# Patient Record
Sex: Male | Born: 1990 | Race: Black or African American | Hispanic: No | Marital: Single | Smoking: Current every day smoker
Health system: Southern US, Community
[De-identification: ages and names within clinical notes are randomized; demographics above are authoritative.]

## PROBLEM LIST (undated history)

## (undated) DIAGNOSIS — J45909 Unspecified asthma, uncomplicated: Secondary | ICD-10-CM

## (undated) HISTORY — PX: NO PAST SURGERIES: SHX2092

---
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
## 2010-02-17 ENCOUNTER — Emergency Department (HOSPITAL_COMMUNITY)
Admission: EM | Admit: 2010-02-17 | Discharge: 2010-02-18 | Payer: Self-pay | Source: Home / Self Care | Admitting: Emergency Medicine

## 2010-04-12 ENCOUNTER — Emergency Department (HOSPITAL_COMMUNITY): Admission: EM | Admit: 2010-04-12 | Discharge: 2010-04-12 | Payer: Self-pay | Admitting: Emergency Medicine

## 2010-04-15 ENCOUNTER — Emergency Department (HOSPITAL_COMMUNITY): Admission: EM | Admit: 2010-04-15 | Discharge: 2010-04-15 | Payer: Self-pay | Admitting: Emergency Medicine

## 2010-09-23 LAB — WOUND CULTURE

## 2010-09-27 LAB — GLUCOSE, CAPILLARY: Glucose-Capillary: 86 mg/dL (ref 70–99)

## 2012-02-06 ENCOUNTER — Encounter (HOSPITAL_COMMUNITY): Payer: Self-pay | Admitting: *Deleted

## 2012-02-06 DIAGNOSIS — R197 Diarrhea, unspecified: Secondary | ICD-10-CM | POA: Insufficient documentation

## 2012-02-06 DIAGNOSIS — R112 Nausea with vomiting, unspecified: Secondary | ICD-10-CM | POA: Insufficient documentation

## 2012-02-06 DIAGNOSIS — F17200 Nicotine dependence, unspecified, uncomplicated: Secondary | ICD-10-CM | POA: Insufficient documentation

## 2012-02-06 NOTE — ED Notes (Signed)
Pt c/o generalized abdominal pain for the past 5 hours, dry heaving.  Last ate at 2 pm.  Diarrhea as well.  Denies dysuria and urinary frequency.

## 2012-02-07 ENCOUNTER — Emergency Department (HOSPITAL_COMMUNITY)
Admission: EM | Admit: 2012-02-07 | Discharge: 2012-02-07 | Disposition: A | Discharge status: Home / Self Care | Payer: Self-pay | Attending: Emergency Medicine | Admitting: Emergency Medicine

## 2012-02-07 DIAGNOSIS — R112 Nausea with vomiting, unspecified: Secondary | ICD-10-CM

## 2012-02-07 HISTORY — DX: Unspecified asthma, uncomplicated: J45.909

## 2012-02-07 LAB — CBC WITH DIFFERENTIAL/PLATELET
Basophils Absolute: 0 K/uL (ref 0.0–0.1)
Basophils Relative: 0 % (ref 0–1)
Eosinophils Absolute: 0.2 K/uL (ref 0.0–0.7)
Eosinophils Relative: 2 % (ref 0–5)
HCT: 48.9 % (ref 39.0–52.0)
Hemoglobin: 16.6 g/dL (ref 13.0–17.0)
Lymphocytes Relative: 7 % — ABNORMAL LOW (ref 12–46)
Lymphs Abs: 0.9 K/uL (ref 0.7–4.0)
MCH: 29 pg (ref 26.0–34.0)
MCHC: 33.9 g/dL (ref 30.0–36.0)
MCV: 85.5 fL (ref 78.0–100.0)
Monocytes Absolute: 0.5 K/uL (ref 0.1–1.0)
Monocytes Relative: 4 % (ref 3–12)
Neutro Abs: 11.5 K/uL — ABNORMAL HIGH (ref 1.7–7.7)
Neutrophils Relative %: 88 % — ABNORMAL HIGH (ref 43–77)
Platelets: 237 K/uL (ref 150–400)
RBC: 5.72 MIL/uL (ref 4.22–5.81)
RDW: 13.6 % (ref 11.5–15.5)
WBC: 13.1 K/uL — ABNORMAL HIGH (ref 4.0–10.5)

## 2012-02-07 LAB — BASIC METABOLIC PANEL
Calcium: 9.9 mg/dL (ref 8.4–10.5)
Creatinine, Ser: 0.84 mg/dL (ref 0.50–1.35)
GFR calc Af Amer: >90 mL/min (ref >90)
Glucose, Bld: 82 mg/dL (ref 70–99)

## 2012-02-07 LAB — URINALYSIS, ROUTINE W REFLEX MICROSCOPIC
Glucose, UA: NEGATIVE mg/dL
Ketones, ur: 15 mg/dL — AB
Nitrite: NEGATIVE
Specific Gravity, Urine: 1.028 (ref 1.005–1.030)

## 2012-02-07 MED ORDER — SODIUM CHLORIDE 0.9 % IV BOLUS (SEPSIS)
1000.0000 mL | Freq: Once | INTRAVENOUS | Status: AC
  Administered 2012-02-07: 1000 mL via INTRAVENOUS

## 2012-02-07 MED ORDER — ONDANSETRON HCL 4 MG/2ML IJ SOLN
4.0000 mg | Freq: Once | INTRAMUSCULAR | Status: AC
  Administered 2012-02-07: 4 mg via INTRAVENOUS
  Filled 2012-02-07: qty 2

## 2012-02-07 MED ORDER — PROMETHAZINE HCL 25 MG PO TABS: 25.0000 mg | ORAL_TABLET | Freq: Four times a day (QID) | ORAL | Status: DC | PRN

## 2012-02-07 NOTE — ED Provider Notes (Signed)
Medical screening examination/treatment/procedure(s) were performed by non-physician practitioner and as supervising physician I was immediately available for consultation/collaboration.  Tahira Olivarez M Olita Takeshita, MD 02/07/12 0646 

## 2012-02-07 NOTE — ED Provider Notes (Signed)
History     CSN: [id number]  Arrival date & time 02/06/12  2321   First MD Initiated Contact with Patient 02/07/12 0122      Chief Complaint  Patient presents with  . Abdominal Pain    (Consider location/radiation/quality/duration/timing/severity/associated sxs/prior treatment) HPI Comments: And states, that approximately 24 hours ago.  He was awakened from sleep with nausea, vomiting, and diarrhea.  This has persisted throughout the day.  He has taken no over-the-counter medications to alleviate any of his symptoms.  He does not have any known sick contacts.  He has not had any fever.  He does report that he had a chill yesterday  Patient is a 21 y.o. male presenting with abdominal pain. The history is provided by the patient.  Abdominal Pain The primary symptoms of the illness include abdominal pain, nausea, vomiting and diarrhea. The primary symptoms of the illness do not include fever or shortness of breath. The current episode started yesterday. The onset of the illness was gradual. The problem has not changed since onset. The patient has not had a change in bowel habit. Additional symptoms associated with the illness include chills.    Past Medical History  Diagnosis Date  . Asthma     History reviewed. No pertinent past surgical history.  History reviewed. No pertinent family history.  History  Substance Use Topics  . Smoking status: Current Everyday Smoker -- 1.0 packs/day  . Smokeless tobacco: Not on file  . Alcohol Use: Yes      Review of Systems  Constitutional: Positive for chills. Negative for fever.  HENT: Negative for rhinorrhea.   Respiratory: Negative for shortness of breath.   Gastrointestinal: Positive for nausea, vomiting, abdominal pain and diarrhea.  Genitourinary: Negative for decreased urine volume.  Neurological: Negative for dizziness, weakness and headaches.    Allergies  Review of patient's allergies indicates no known allergies.  Home  Medications   Current Outpatient Rx  Name Route Sig Dispense Refill  . PROMETHAZINE HCL 25 MG PO TABS Oral Take 1 tablet (25 mg total) by mouth every 6 (six) hours as needed for nausea. 30 tablet 0    BP 130/57  Pulse 95  Temp 99 F (37.2 C) (Oral)  Resp 18  SpO2 100%  Physical Exam  Constitutional: He is oriented to person, place, and time. He appears well-developed and well-nourished.  HENT:  Head: Normocephalic.  Neck: Normal range of motion.  Cardiovascular: Normal rate.   Pulmonary/Chest: Effort normal.  Abdominal: Soft. He exhibits no distension. There is no tenderness.  Neurological: He is alert and oriented to person, place, and time.  Skin: Skin is warm.    ED Course  Procedures (including critical care time)  Labs Reviewed  URINALYSIS, ROUTINE W REFLEX MICROSCOPIC - Abnormal; Notable for the following:    Color, Urine AMBER (*)  BIOCHEMICALS MAY BE AFFECTED BY COLOR   Bilirubin Urine SMALL (*)     Ketones, ur 15 (*)     All other components within normal limits  CBC WITH DIFFERENTIAL - Abnormal; Notable for the following:    WBC 13.1 (*)     Neutrophils Relative 88 (*)     Neutro Abs 11.5 (*)     Lymphocytes Relative 7 (*)     All other components within normal limits  BASIC METABOLIC PANEL   No results found.   1. Nausea vomiting and diarrhea       MDM   After patient was medicated for nausea, with  5 mg of Zofran given 30 mg of Toradol, IV, and 1 L of fluid.  He is feeling much, better, we'll try a by mouth challenge        Arman Filter, NP 02/07/12 [phone]

## 2012-02-07 NOTE — ED Notes (Addendum)
Pt states he is having mid abdominal pain 1 a.m.Marland Kitchen Pt states he had 1 bm about 3 hours that was solid. Pt states pain is at 3. Pt denies taking any thing for pain. PT c/o nausea/ pt denies vomiting but c/o dry heaving. Pt states he hasn't ate since 1pm. Pt c/o of lose of appetite.

## 2013-01-29 ENCOUNTER — Encounter (HOSPITAL_COMMUNITY): Payer: Self-pay | Admitting: Adult Health

## 2013-01-29 ENCOUNTER — Emergency Department (HOSPITAL_COMMUNITY)
Admission: EM | Admit: 2013-01-29 | Discharge: 2013-01-30 | Discharge status: Against Medical Advice | Payer: Self-pay | Attending: Emergency Medicine | Admitting: Emergency Medicine

## 2013-01-29 DIAGNOSIS — R209 Unspecified disturbances of skin sensation: Secondary | ICD-10-CM | POA: Insufficient documentation

## 2013-01-29 LAB — CBC
HCT: 45.2 % (ref 39.0–52.0)
MCH: 29.3 pg (ref 26.0–34.0)
MCV: 81.9 fL (ref 78.0–100.0)
RDW: 13.2 % (ref 11.5–15.5)
WBC: 9.7 10*3/uL (ref 4.0–10.5)

## 2013-01-29 LAB — BASIC METABOLIC PANEL
BUN: 12 mg/dL (ref 6–23)
Creatinine, Ser: 0.96 mg/dL (ref 0.50–1.35)
GFR calc Af Amer: >90 mL/min (ref >90)
GFR calc non Af Amer: >90 mL/min (ref >90)

## 2013-01-29 LAB — POCT I-STAT TROPONIN I: Troponin i, poc: 0.01 ng/mL (ref 0.00–0.08)

## 2013-01-29 NOTE — ED Notes (Signed)
NURSE FIRST ROUNDS : NURSE EXPLAINED DELAY / PROCESS AND DELAY TO PT, ALERT / RESPIRATIONS UNLABORED / DENIES  PAIN AT THIS TIME.

## 2013-01-29 NOTE — ED Notes (Signed)
Presents with left sided arm tingling and pain that began Monday and is intermittent. It started with a sharp left chest pain then tingling from left shoulder to toes associated with SOB on Monday. Denies nausea and vomiting, denis dizziness.

## 2013-01-29 NOTE — ED Notes (Signed)
Pt requesting to leave AMA. Pt seen leaving with friend.

## 2013-02-01 ENCOUNTER — Emergency Department (HOSPITAL_COMMUNITY)
Admission: EM | Admit: 2013-02-01 | Discharge: 2013-02-01 | Disposition: A | Discharge status: Home / Self Care | Payer: Self-pay | Attending: Emergency Medicine | Admitting: Emergency Medicine

## 2013-02-01 ENCOUNTER — Encounter (HOSPITAL_COMMUNITY): Payer: Self-pay | Admitting: *Deleted

## 2013-02-01 DIAGNOSIS — F17200 Nicotine dependence, unspecified, uncomplicated: Secondary | ICD-10-CM | POA: Insufficient documentation

## 2013-02-01 DIAGNOSIS — IMO0002 Reserved for concepts with insufficient information to code with codable children: Secondary | ICD-10-CM | POA: Insufficient documentation

## 2013-02-01 DIAGNOSIS — J45909 Unspecified asthma, uncomplicated: Secondary | ICD-10-CM | POA: Insufficient documentation

## 2013-02-01 DIAGNOSIS — M792 Neuralgia and neuritis, unspecified: Secondary | ICD-10-CM

## 2013-02-01 MED ORDER — IBUPROFEN 800 MG PO TABS: 800.0000 mg | ORAL_TABLET | Freq: Three times a day (TID) | ORAL | Status: DC

## 2013-02-01 MED ORDER — METHOCARBAMOL 500 MG PO TABS: 500.0000 mg | ORAL_TABLET | Freq: Two times a day (BID) | ORAL | Status: DC

## 2013-02-01 NOTE — ED Notes (Signed)
The pt is c/o lt hand pain no known injury. He had lt arm shoulder and elbow pain 4 days ago.  No history  Cardiac or otherwise

## 2013-02-01 NOTE — ED Provider Notes (Signed)
CSN: [id number]     Arrival date & time 02/01/13  1757 History  This chart was scribed for non-physician practitioner, Fayrene Helper, PA-C working with Alvin Webb. Oletta Lamas, MD by Greggory Stallion, ED scribe. This patient was seen in room TR08C/TR08C and the patient's care was started at 6:14 PM.   Chief Complaint  Patient presents with  . Hand Pain   The history is provided by the patient. No language interpreter was used.    HPI Comments: Alvin Webb is a 22 y.o. male who presents to the Emergency Department complaining of gradual onset, constant sharp, throbbing left forearm pain that radiates to his hand that started 4 days ago. He states his whole arm was hurting at first. Pt denies injury. Pt states flexing his hand relieves some of the pain. He had CP and trouble breathing for 15 minutes a few days ago but it is resolved now. Pt denies fever, chills and cough as associated symptoms. He states he donated plasma 2 days ago from his right arm. Pt had asthma until he was about 22 years old.   Past Medical History  Diagnosis Date  . Asthma    History reviewed. No pertinent past surgical history. No family history on file. History  Substance Use Topics  . Smoking status: Current Every Day Smoker -- 1.00 packs/day  . Smokeless tobacco: Not on file  . Alcohol Use: Yes    Review of Systems  Constitutional: Negative for fever and chills.  Respiratory: Negative for cough.   Cardiovascular: Negative for chest pain.  Musculoskeletal: Positive for myalgias and arthralgias.  All other systems reviewed and are negative.    Allergies  Review of patient's allergies indicates no known allergies.  Home Medications   Current Outpatient Rx  Name  Route  Sig  Dispense  Refill  . ibuprofen (ADVIL,MOTRIN) 200 MG tablet   Oral   Take 400 mg by mouth every 6 (six) hours as needed for headache.          BP 150/74  Pulse 89  Temp(Src) 98.5 F (36.9 C) (Oral)  Resp 16  SpO2 98%  Physical  Exam  Nursing note and vitals reviewed. Constitutional: He is oriented to person, place, and time. He appears well-developed and well-nourished. No distress.  HENT:  Head: Normocephalic and atraumatic.  Eyes: EOM are normal.  Neck: Neck supple. No tracheal deviation present.  Cardiovascular: Normal rate, regular rhythm and normal heart sounds.   Pulmonary/Chest: Effort normal and breath sounds normal. No respiratory distress. He has no wheezes. He has no rales.  Musculoskeletal: Normal range of motion.  Full ROM of left wrist, shoulder and elbow. Soft compartment in forearm. No rash. No edema. Strong radial pulse. Good grip strength.   Neurological: He is alert and oriented to person, place, and time.  Intact distal sensation to all fingers.   Skin: Skin is warm and dry. No rash noted.  Psychiatric: He has a normal mood and affect. His behavior is normal.    ED Course   Procedures (including critical care time)  DIAGNOSTIC STUDIES: Oxygen Saturation is 98% on RA, normal by my interpretation.    COORDINATION OF CARE: 6:25 PM-Discussed treatment plan which includes ibuprofen and a muscle relaxer with pt at bedside and pt agreed to plan. Advised pt there is no injury to his arm since he has full ROM. Since there is no rash or redness, advised pt it is not an infection. Advised pt to follow up with an orthopaedist  if symptoms worsen or do not resolve.   Low suspicion for cardiopulmonary etiology. Pt is neurovascularly intact.     Labs Reviewed - No data to display No results found. 1. Radicular pain in left arm     MDM  BP 150/74  Pulse 89  Temp(Src) 98.5 F (36.9 C) (Oral)  Resp 16  SpO2 98%   I personally performed the services described in this documentation, which was scribed in my presence. The recorded information has been reviewed and is accurate.    Fayrene Helper, PA-C 02/01/13 [phone]

## 2013-02-02 NOTE — ED Provider Notes (Signed)
Medical screening examination/treatment/procedure(s) were performed by non-physician practitioner and as supervising physician I was immediately available for consultation/collaboration.   Kolby Y. Tamberlyn Midgley, MD 02/02/13 1702 

## 2013-05-05 ENCOUNTER — Encounter (HOSPITAL_COMMUNITY): Payer: Self-pay | Admitting: Emergency Medicine

## 2013-05-05 ENCOUNTER — Emergency Department (HOSPITAL_COMMUNITY)
Admission: EM | Admit: 2013-05-05 | Discharge: 2013-05-05 | Disposition: A | Discharge status: Home / Self Care | Payer: Self-pay | Attending: Emergency Medicine | Admitting: Emergency Medicine

## 2013-05-05 ENCOUNTER — Emergency Department (HOSPITAL_COMMUNITY): Payer: Self-pay

## 2013-05-05 DIAGNOSIS — J45909 Unspecified asthma, uncomplicated: Secondary | ICD-10-CM | POA: Insufficient documentation

## 2013-05-05 DIAGNOSIS — F17200 Nicotine dependence, unspecified, uncomplicated: Secondary | ICD-10-CM | POA: Insufficient documentation

## 2013-05-05 DIAGNOSIS — S62629A Displaced fracture of medial phalanx of unspecified finger, initial encounter for closed fracture: Secondary | ICD-10-CM

## 2013-05-05 DIAGNOSIS — IMO0002 Reserved for concepts with insufficient information to code with codable children: Secondary | ICD-10-CM | POA: Insufficient documentation

## 2013-05-05 DIAGNOSIS — Y929 Unspecified place or not applicable: Secondary | ICD-10-CM | POA: Insufficient documentation

## 2013-05-05 DIAGNOSIS — Y990 Civilian activity done for income or pay: Secondary | ICD-10-CM | POA: Insufficient documentation

## 2013-05-05 MED ORDER — HYDROCODONE-ACETAMINOPHEN 5-325 MG PO TABS: 2.0000 | ORAL_TABLET | ORAL | Status: DC | PRN

## 2013-05-05 MED ORDER — HYDROCODONE-ACETAMINOPHEN 5-325 MG PO TABS
1.0000 | ORAL_TABLET | Freq: Once | ORAL | Status: AC
  Administered 2013-05-05: 1 via ORAL
  Filled 2013-05-05: qty 1

## 2013-05-05 NOTE — Progress Notes (Signed)
Orthopedic Tech Progress Note Patient Details:  Alvin Webb Feb 15, 1991 [id number]  Ortho Devices Type of Ortho Device: Finger splint   Alvin Webb 05/05/2013, 11:26 PM

## 2013-05-05 NOTE — ED Provider Notes (Signed)
Medical screening examination/treatment/procedure(s) were performed by non-physician practitioner and as supervising physician I was immediately available for consultation/collaboration.  EKG Interpretation   None        Ethelda Chick, MD 05/05/13 [phone]

## 2013-05-05 NOTE — ED Notes (Signed)
Ortho paged. 

## 2013-05-05 NOTE — ED Provider Notes (Signed)
CSN: [id number]     Arrival date & time 05/05/13  2117 History  This chart was scribed for non-physician practitioner, Wynetta Emery, PA-C working with Ethelda Chick, MD by Greggory Stallion, ED scribe. This patient was seen in room TR08C/TR08C and the patient's care was started at 10:47 PM.   Chief Complaint  Patient presents with  . Finger Injury   The history is provided by the patient. No language interpreter was used.   HPI Comments: Alvin Webb is a 22 y.o. male who presents to the Emergency Department complaining of right middle finger injury that occurred earlier today when he jammed his finger on a tire at work. He has sudden onset right middle finger pain with associated swelling. Pt rates his pain 7/10. Pt states he saw a deformity when it happened so he pulled his finger straight and the pain went away but returned a few hours later.   Past Medical History  Diagnosis Date  . Asthma    History reviewed. No pertinent past surgical history. History reviewed. No pertinent family history. History  Substance Use Topics  . Smoking status: Current Every Day Smoker -- 1.00 packs/day  . Smokeless tobacco: Not on file  . Alcohol Use: Yes    Review of Systems A complete 10 system review of systems was obtained and all systems are negative except as noted in the HPI and PMH.   Allergies  Review of patient's allergies indicates no known allergies.  Home Medications   Current Outpatient Rx  Name  Route  Sig  Dispense  Refill  . ibuprofen (ADVIL,MOTRIN) 200 MG tablet   Oral   Take 800 mg by mouth every 6 (six) hours as needed for moderate pain.         Marland Kitchen HYDROcodone-acetaminophen (NORCO/VICODIN) 5-325 MG per tablet   Oral   Take 2 tablets by mouth every 4 (four) hours as needed.   6 tablet   0    BP 155/91  Pulse 68  Resp 16  SpO2 100%  Physical Exam  Nursing note and vitals reviewed. Constitutional: He is oriented to person, place, and time. He appears  well-developed and well-nourished. No distress.  HENT:  Head: Normocephalic.  Eyes: Conjunctivae and EOM are normal.  Cardiovascular: Normal rate.   Pulmonary/Chest: Effort normal. No stridor.  Musculoskeletal: Normal range of motion.  Mild swelling to right middle finger PIP. Mildly reduced range of motion, neurovascularly intact.  Neurological: He is alert and oriented to person, place, and time.  Psychiatric: He has a normal mood and affect.    ED Course  Procedures (including critical care time)  DIAGNOSTIC STUDIES: Oxygen Saturation is 100% on RA, normal by my interpretation.    COORDINATION OF CARE: 10:50 PM-Discussed treatment plan which includes pain medication and a finger splint with pt at bedside and pt agreed to plan. Advised pt to follow up with a hand specialist.   Labs Review Labs Reviewed - No data to display Imaging Review [name]  05/05/2013   CLINICAL DATA:  Finger injury.  EXAM: RIGHT HAND - COMPLETE 3+ VIEW  COMPARISON:  Concern none available  FINDINGS: Tiny avulsion fracture base of 3rd middle phalanx, volar aspect. No dislocation. No additional fractures. No destructive bony lesions. Joint space intact without erosions. Third digit soft tissue swelling without subcutaneous gas or radiopaque foreign bodies.  IMPRESSION: Tiny avulsion injury base of 3rd middle phalanx, volar aspect, please see finger radiograph from same day, reported separately for dedicated  findings. No dislocation or additional fractures.   Electronically Signed   By: Awilda Metro   On: 05/05/2013 22:41   [name]  05/05/2013   CLINICAL DATA:  Finger injury.  EXAM: RIGHT MIDDLE FINGER 2+V  COMPARISON:  None available for comparison at time of study interpretation.  FINDINGS: Tiny apparent avulsion fracture with 2 mm bony fragment arising from the base of 3rd middle phalanx, volar aspect. No dislocation. No destructive bony lesions. Third digit soft tissue  swelling without subcutaneous gas or radiopaque foreign bodies.  IMPRESSION: Avulsion fracture base of 3rd middle phalanx, volar aspect without dislocation.   Electronically Signed   By: Awilda Metro   On: 05/05/2013 22:34    EKG Interpretation   None       MDM   1. Avulsion fracture of middle phalanx of finger, closed, initial encounter    Filed Vitals:   05/05/13 2125  BP: 155/91  Pulse: 68  Resp: 16  SpO2: 100%     [name] is a 22 y.o. male with small avulsion fracture to right middle digit middle phalanx. Patient will be given a finger splint, pain control and hand followup.  Medications  HYDROcodone-acetaminophen (NORCO/VICODIN) 5-325 MG per tablet 1 tablet (not administered)    Pt is hemodynamically stable, appropriate for, and amenable to discharge at this time. Pt verbalized understanding and agrees with care plan. All questions answered. Outpatient follow-up and specific return precautions discussed.    New Prescriptions   HYDROCODONE-ACETAMINOPHEN (NORCO/VICODIN) 5-325 MG PER TABLET    Take 2 tablets by mouth every 4 (four) hours as needed.    I personally performed the services described in this documentation, which was scribed in my presence. The recorded information has been reviewed and is accurate.  Note: Portions of this report may have been transcribed using voice recognition software. Every effort was made to ensure accuracy; however, inadvertent computerized transcription errors may be present    Wynetta Emery, PA-C 05/05/13 2259

## 2013-05-05 NOTE — ED Notes (Signed)
Presents with right middle finger swelling and pain began after a tire jumped up and jammed finger while working. Reports seeing a deformity and pulling the finger straight, the pain then went away, however returned a few hours ;ater. Pt finished rest of day at work and came here to be evaluated. CMS intact.

## 2013-05-05 NOTE — ED Notes (Signed)
Patient transported to X-ray 

## 2013-09-11 ENCOUNTER — Emergency Department (HOSPITAL_COMMUNITY)
Admission: EM | Admit: 2013-09-11 | Discharge: 2013-09-12 | Disposition: A | Discharge status: Home / Self Care | Payer: Self-pay | Attending: Emergency Medicine | Admitting: Emergency Medicine

## 2013-09-11 ENCOUNTER — Encounter (HOSPITAL_COMMUNITY): Payer: Self-pay | Admitting: Emergency Medicine

## 2013-09-11 DIAGNOSIS — J45909 Unspecified asthma, uncomplicated: Secondary | ICD-10-CM | POA: Insufficient documentation

## 2013-09-11 DIAGNOSIS — F17200 Nicotine dependence, unspecified, uncomplicated: Secondary | ICD-10-CM | POA: Insufficient documentation

## 2013-09-11 DIAGNOSIS — IMO0002 Reserved for concepts with insufficient information to code with codable children: Secondary | ICD-10-CM | POA: Insufficient documentation

## 2013-09-11 DIAGNOSIS — Y9355 Activity, bike riding: Secondary | ICD-10-CM | POA: Insufficient documentation

## 2013-09-11 DIAGNOSIS — Y92410 Unspecified street and highway as the place of occurrence of the external cause: Secondary | ICD-10-CM | POA: Insufficient documentation

## 2013-09-11 DIAGNOSIS — S82853A Displaced trimalleolar fracture of unspecified lower leg, initial encounter for closed fracture: Secondary | ICD-10-CM | POA: Insufficient documentation

## 2013-09-11 MED ORDER — HYDROMORPHONE HCL PF 1 MG/ML IJ SOLN
1.0000 mg | Freq: Once | INTRAMUSCULAR | Status: AC
  Administered 2013-09-11: 1 mg via INTRAVENOUS
  Filled 2013-09-11: qty 1

## 2013-09-11 MED ORDER — ONDANSETRON HCL 4 MG/2ML IJ SOLN
4.0000 mg | Freq: Once | INTRAMUSCULAR | Status: AC
  Administered 2013-09-11: 4 mg via INTRAVENOUS
  Filled 2013-09-11: qty 2
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
## 2013-09-11 NOTE — ED Notes (Signed)
C-Collar removed from pt by Dr. Anitra LauthPlunkett.  Pt log rolled and spine board remove while using c-spine precautions.  Pt tolerated well.

## 2013-09-11 NOTE — ED Notes (Signed)
Pt states he did have alcohol to drink today.

## 2013-09-12 ENCOUNTER — Emergency Department (HOSPITAL_COMMUNITY): Payer: Self-pay

## 2013-09-12 ENCOUNTER — Emergency Department (HOSPITAL_COMMUNITY): Payer: No Typology Code available for payment source

## 2013-09-12 MED ORDER — HYDROMORPHONE HCL PF 1 MG/ML IJ SOLN
1.0000 mg | Freq: Once | INTRAMUSCULAR | Status: AC
  Administered 2013-09-12: 1 mg via INTRAVENOUS
  Filled 2013-09-12: qty 1

## 2013-09-12 MED ORDER — OXYCODONE-ACETAMINOPHEN 5-325 MG PO TABS: 1.0000 | ORAL_TABLET | Freq: Four times a day (QID) | ORAL | Status: DC | PRN

## 2013-09-12 MED ORDER — HYDROMORPHONE HCL PF 1 MG/ML IJ SOLN
1.0000 mg | Freq: Once | INTRAMUSCULAR | Status: AC
Start: 2013-09-12 — End: 2013-09-12
  Administered 2013-09-12: 1 mg via INTRAVENOUS
  Filled 2013-09-12: qty 1

## 2013-09-12 NOTE — Discharge Instructions (Signed)
°  Ankle Fracture °A fracture is a break in the bone. A cast or splint is used to protect and keep your injured bone from moving.  °HOME CARE INSTRUCTIONS  °· Use your crutches as directed. °· To lessen the swelling, keep the injured leg elevated while sitting or lying down. °· Apply ice to the injury for 15-20 minutes, 03-04 times per day while awake for 2 days. Put the ice in a plastic bag and place a thin towel between the bag of ice and your cast. °· If you have a plaster or fiberglass cast: °· Do not try to scratch the skin under the cast using sharp or pointed objects. °· Check the skin around the cast every day. You may put lotion on any red or sore areas. °· Keep your cast dry and clean. °· If you have a plaster splint: °· Wear the splint as directed. °· You may loosen the elastic around the splint if your toes become numb, tingle, or turn cold or blue. °· Do not put pressure on any part of your cast or splint; it may break. Rest your cast only on a pillow the first 24 hours until it is fully hardened. °· Your cast or splint can be protected during bathing with a plastic bag. Do not lower the cast or splint into water. °· Take medications as directed by your caregiver. Only take over-the-counter or prescription medicines for pain, discomfort, or fever as directed by your caregiver. °· Do not drive a vehicle until your caregiver specifically tells you it is safe to do so. °· If your caregiver has given you a follow-up appointment, it is very important to keep that appointment. Not keeping the appointment could result in a chronic or permanent injury, pain, and disability. If there is any problem keeping the appointment, you must call back to this facility for assistance. °SEEK IMMEDIATE MEDICAL CARE IF:  °· Your cast gets damaged or breaks. °· You have continued severe pain or more swelling than you did before the cast was put on. °· Your skin or toenails below the injury turn blue or gray, or feel cold or  numb. °· There is a bad smell or new stains and/or purulent (pus like) drainage coming from under the cast. °If you do not have a window in your cast for observing the wound, a discharge or minor bleeding may show up as a stain on the outside of your cast. Report these findings to your caregiver. °MAKE SURE YOU:  °· Understand these instructions. °· Will watch your condition. °· Will get help right away if you are not doing well or get worse. °Document Released: 06/02/2000 Document Revised: 08/28/2011 Document Reviewed: 01/02/2013 °[employer]® Patient Information ©2014 [employer]. ° ° °
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
## 2013-09-18 ENCOUNTER — Encounter (HOSPITAL_COMMUNITY): Payer: Self-pay | Admitting: Pharmacy Technician
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
## 2013-09-24 NOTE — Pre-Procedure Instructions (Signed)
Alvin SchillingMichael Webb  09/24/2013   Your procedure is scheduled on:  09/26/13  [facility]  [facility] * 3 at 6 AM.  Call this number if you have problems the morning of surgery: [phone]   Remember:   Do not eat food or drink liquids after midnight.   Take these medicines the morning of surgery with A SIP OF WATER: oxycodone   Do not wear jewelry, make-up or nail polish.  Do not wear lotions, powders, or perfumes. You may wear deodorant.  Do not shave 48 hours prior to surgery. Men may shave face and neck.  Do not bring valuables to the hospital.  [facility]                  for any belongings or valuables.               Contacts, dentures or bridgework may not be worn into surgery.  Leave suitcase in the car. After surgery it may be brought to your room.  For patients admitted to the hospital, discharge time is determined by your                treatment team.               Patients discharged the day of surgery will not be allowed to drive  home.  Name and phone number of your driver: family  Special Instructions: Shower using CHG 2 nights before surgery and the night before surgery.  If you shower the day of surgery use CHG.  Use special wash - you have one bottle of CHG for all showers.  You should use approximately 1/3 of the bottle for each shower.   Please read over the following fact sheets that you were given: Pain Booklet, Coughing and Deep Breathing and Surgical Site Infection Prevention

## 2013-09-25 ENCOUNTER — Encounter (HOSPITAL_COMMUNITY)
Admission: RE | Admit: 2013-09-25 | Discharge: 2013-09-25 | Disposition: A | Discharge status: Home / Self Care | Payer: Self-pay | Source: Ambulatory Visit | Attending: Orthopedic Surgery | Admitting: Orthopedic Surgery

## 2013-09-25 ENCOUNTER — Encounter (HOSPITAL_COMMUNITY): Payer: Self-pay

## 2013-09-25 DIAGNOSIS — Z01812 Encounter for preprocedural laboratory examination: Secondary | ICD-10-CM | POA: Insufficient documentation

## 2013-09-25 LAB — BASIC METABOLIC PANEL
BUN: 9 mg/dL (ref 6–23)
CALCIUM: 9.7 mg/dL (ref 8.4–10.5)
CO2: 26 meq/L (ref 19–32)
Chloride: 101 mEq/L (ref 96–112)
Creatinine, Ser: 0.96 mg/dL (ref 0.50–1.35)
GFR calc Af Amer: >90 mL/min (ref >90)
GFR calc non Af Amer: >90 mL/min (ref >90)
GLUCOSE: 73 mg/dL (ref 70–99)
POTASSIUM: 4.3 meq/L (ref 3.7–5.3)
SODIUM: 142 meq/L (ref 137–147)

## 2013-09-25 LAB — CBC
HCT: 44.1 % (ref 39.0–52.0)
HEMOGLOBIN: 14.9 g/dL (ref 13.0–17.0)
MCH: 28.8 pg (ref 26.0–34.0)
MCHC: 33.8 g/dL (ref 30.0–36.0)
MCV: 85.1 fL (ref 78.0–100.0)
Platelets: 352 10*3/uL (ref 150–400)
RBC: 5.18 MIL/uL (ref 4.22–5.81)
RDW: 12.6 % (ref 11.5–15.5)
WBC: 10.3 10*3/uL (ref 4.0–10.5)

## 2013-09-25 MED ORDER — CEFAZOLIN SODIUM-DEXTROSE 2-3 GM-% IV SOLR
2.0000 g | INTRAVENOUS | Status: AC
  Administered 2013-09-26: 2 g via INTRAVENOUS
  Filled 2013-09-25: qty 50

## 2013-09-25 MED ORDER — ACETAMINOPHEN 500 MG PO TABS
1000.0000 mg | ORAL_TABLET | Freq: Once | ORAL | Status: AC
  Administered 2013-09-26: 1000 mg via ORAL
  Filled 2013-09-25: qty 2

## 2013-09-26 ENCOUNTER — Ambulatory Visit (HOSPITAL_COMMUNITY): Payer: Self-pay | Admitting: Anesthesiology

## 2013-09-26 ENCOUNTER — Ambulatory Visit (HOSPITAL_COMMUNITY): Payer: Self-pay

## 2013-09-26 ENCOUNTER — Ambulatory Visit (HOSPITAL_COMMUNITY)
Admission: RE | Admit: 2013-09-26 | Discharge: 2013-09-27 | Disposition: A | Discharge status: Home / Self Care | Payer: Self-pay | Source: Ambulatory Visit | Attending: Orthopedic Surgery | Admitting: Orthopedic Surgery

## 2013-09-26 ENCOUNTER — Encounter (HOSPITAL_COMMUNITY)
Admission: RE | Disposition: A | Discharge status: Home / Self Care | Payer: Self-pay | Source: Ambulatory Visit | Attending: Orthopedic Surgery

## 2013-09-26 ENCOUNTER — Encounter (HOSPITAL_COMMUNITY): Payer: Self-pay | Admitting: Surgery

## 2013-09-26 ENCOUNTER — Encounter (HOSPITAL_COMMUNITY): Payer: Self-pay | Admitting: Anesthesiology

## 2013-09-26 DIAGNOSIS — S82853A Displaced trimalleolar fracture of unspecified lower leg, initial encounter for closed fracture: Secondary | ICD-10-CM | POA: Insufficient documentation

## 2013-09-26 DIAGNOSIS — F1210 Cannabis abuse, uncomplicated: Secondary | ICD-10-CM | POA: Insufficient documentation

## 2013-09-26 DIAGNOSIS — F17200 Nicotine dependence, unspecified, uncomplicated: Secondary | ICD-10-CM | POA: Insufficient documentation

## 2013-09-26 DIAGNOSIS — S82891A Other fracture of right lower leg, initial encounter for closed fracture: Secondary | ICD-10-CM | POA: Diagnosis present

## 2013-09-26 HISTORY — PX: ORIF ANKLE FRACTURE: SHX5408

## 2013-09-26 LAB — CBC
HEMATOCRIT: 41 % (ref 39.0–52.0)
Hemoglobin: 13.7 g/dL (ref 13.0–17.0)
MCH: 28.4 pg (ref 26.0–34.0)
MCHC: 33.4 g/dL (ref 30.0–36.0)
MCV: 85.1 fL (ref 78.0–100.0)
PLATELETS: 313 10*3/uL (ref 150–400)
RBC: 4.82 MIL/uL (ref 4.22–5.81)
RDW: 12.7 % (ref 11.5–15.5)
WBC: 14.6 10*3/uL — AB (ref 4.0–10.5)

## 2013-09-26 LAB — SURGICAL PCR SCREEN
MRSA, PCR: NEGATIVE
STAPHYLOCOCCUS AUREUS: NEGATIVE

## 2013-09-26 LAB — CREATININE, SERUM
Creatinine, Ser: 0.91 mg/dL (ref 0.50–1.35)
GFR calc non Af Amer: >90 mL/min (ref >90)

## 2013-09-26 SURGERY — OPEN REDUCTION INTERNAL FIXATION (ORIF) ANKLE FRACTURE
Anesthesia: General | Site: Ankle | Laterality: Right

## 2013-09-26 MED ORDER — OXYCODONE HCL 5 MG PO TABS
ORAL_TABLET | ORAL | Status: AC
  Filled 2013-09-26: qty 1

## 2013-09-26 MED ORDER — METOCLOPRAMIDE HCL 10 MG PO TABS: 5.0000 mg | ORAL_TABLET | Freq: Three times a day (TID) | ORAL | Status: DC | PRN

## 2013-09-26 MED ORDER — PROMETHAZINE HCL 25 MG/ML IJ SOLN: 6.2500 mg | INTRAMUSCULAR | Status: DC | PRN

## 2013-09-26 MED ORDER — LIDOCAINE HCL 2 % EX GEL
CUTANEOUS | Status: AC
  Filled 2013-09-26: qty 20

## 2013-09-26 MED ORDER — LIDOCAINE HCL (CARDIAC) 20 MG/ML IV SOLN
INTRAVENOUS | Status: AC
  Filled 2013-09-26: qty 5

## 2013-09-26 MED ORDER — OXYCODONE HCL 5 MG/5ML PO SOLN: 5.0000 mg | Freq: Once | ORAL | Status: AC | PRN

## 2013-09-26 MED ORDER — FENTANYL CITRATE 0.05 MG/ML IJ SOLN
INTRAMUSCULAR | Status: AC
  Filled 2013-09-26: qty 5

## 2013-09-26 MED ORDER — ONDANSETRON HCL 4 MG/2ML IJ SOLN
INTRAMUSCULAR | Status: DC | PRN
Start: 2013-09-26 — End: 2013-09-26
  Administered 2013-09-26: 4 mg via INTRAVENOUS

## 2013-09-26 MED ORDER — ENOXAPARIN SODIUM 40 MG/0.4ML ~~LOC~~ SOLN
40.0000 mg | SUBCUTANEOUS | Status: DC
  Administered 2013-09-26: 40 mg via SUBCUTANEOUS
  Filled 2013-09-26 (×2): qty 0.4

## 2013-09-26 MED ORDER — PROPOFOL 10 MG/ML IV BOLUS
INTRAVENOUS | Status: DC | PRN
  Administered 2013-09-26: 200 mg via INTRAVENOUS

## 2013-09-26 MED ORDER — 0.9 % SODIUM CHLORIDE (POUR BTL) OPTIME
TOPICAL | Status: DC | PRN
  Administered 2013-09-26: 1000 mL

## 2013-09-26 MED ORDER — GLYCOPYRROLATE 0.2 MG/ML IJ SOLN
INTRAMUSCULAR | Status: AC
  Filled 2013-09-26: qty 2

## 2013-09-26 MED ORDER — NEOSTIGMINE METHYLSULFATE 1 MG/ML IJ SOLN
INTRAMUSCULAR | Status: AC
  Filled 2013-09-26: qty 10

## 2013-09-26 MED ORDER — ROCURONIUM BROMIDE 100 MG/10ML IV SOLN
INTRAVENOUS | Status: DC | PRN
  Administered 2013-09-26: 40 mg via INTRAVENOUS

## 2013-09-26 MED ORDER — BUPIVACAINE HCL (PF) 0.25 % IJ SOLN
INTRAMUSCULAR | Status: DC | PRN
  Administered 2013-09-26: 20 mL

## 2013-09-26 MED ORDER — ONDANSETRON HCL 4 MG/2ML IJ SOLN
INTRAMUSCULAR | Status: AC
  Filled 2013-09-26: qty 2

## 2013-09-26 MED ORDER — LIDOCAINE HCL (CARDIAC) 20 MG/ML IV SOLN
INTRAVENOUS | Status: DC | PRN
  Administered 2013-09-26: 80 mg via INTRAVENOUS

## 2013-09-26 MED ORDER — CEFAZOLIN SODIUM 1-5 GM-% IV SOLN
1.0000 g | Freq: Four times a day (QID) | INTRAVENOUS | Status: AC
  Administered 2013-09-26 – 2013-09-27 (×3): 1 g via INTRAVENOUS
  Filled 2013-09-26 (×3): qty 50

## 2013-09-26 MED ORDER — METHOCARBAMOL 500 MG PO TABS
ORAL_TABLET | ORAL | Status: AC
  Filled 2013-09-26: qty 1

## 2013-09-26 MED ORDER — ROCURONIUM BROMIDE 50 MG/5ML IV SOLN
INTRAVENOUS | Status: AC
  Filled 2013-09-26: qty 1

## 2013-09-26 MED ORDER — POTASSIUM CHLORIDE IN NACL 20-0.9 MEQ/L-% IV SOLN
INTRAVENOUS | Status: DC
  Administered 2013-09-26: 20:00:00 via INTRAVENOUS
  Filled 2013-09-26 (×3): qty 1000

## 2013-09-26 MED ORDER — HYDROMORPHONE HCL PF 1 MG/ML IJ SOLN
INTRAMUSCULAR | Status: AC
  Filled 2013-09-26: qty 1

## 2013-09-26 MED ORDER — MIDAZOLAM HCL 2 MG/2ML IJ SOLN
INTRAMUSCULAR | Status: AC
  Filled 2013-09-26: qty 2

## 2013-09-26 MED ORDER — MIDAZOLAM HCL 5 MG/5ML IJ SOLN
INTRAMUSCULAR | Status: DC | PRN
  Administered 2013-09-26: 2 mg via INTRAVENOUS

## 2013-09-26 MED ORDER — CEFAZOLIN SODIUM-DEXTROSE 2-3 GM-% IV SOLR
INTRAVENOUS | Status: AC
Start: 2013-09-26 — End: 2013-09-26
  Filled 2013-09-26: qty 50

## 2013-09-26 MED ORDER — BUPIVACAINE HCL (PF) 0.25 % IJ SOLN
INTRAMUSCULAR | Status: AC
  Filled 2013-09-26: qty 30

## 2013-09-26 MED ORDER — ONDANSETRON HCL 4 MG/2ML IJ SOLN: 4.0000 mg | Freq: Four times a day (QID) | INTRAMUSCULAR | Status: DC | PRN

## 2013-09-26 MED ORDER — DIPHENHYDRAMINE HCL 12.5 MG/5ML PO ELIX: 12.5000 mg | ORAL_SOLUTION | ORAL | Status: DC | PRN

## 2013-09-26 MED ORDER — METHOCARBAMOL 500 MG PO TABS
500.0000 mg | ORAL_TABLET | Freq: Four times a day (QID) | ORAL | Status: DC | PRN
  Administered 2013-09-26: 1000 mg via ORAL
  Administered 2013-09-26: 500 mg via ORAL
  Administered 2013-09-27 (×2): 1000 mg via ORAL
  Filled 2013-09-26 (×4): qty 2

## 2013-09-26 MED ORDER — FENTANYL CITRATE 0.05 MG/ML IJ SOLN
INTRAMUSCULAR | Status: DC | PRN
  Administered 2013-09-26: 50 ug via INTRAVENOUS
  Administered 2013-09-26: 100 ug via INTRAVENOUS
  Administered 2013-09-26 (×2): 50 ug via INTRAVENOUS
  Administered 2013-09-26: 100 ug via INTRAVENOUS

## 2013-09-26 MED ORDER — GLYCOPYRROLATE 0.2 MG/ML IJ SOLN
INTRAMUSCULAR | Status: DC | PRN
  Administered 2013-09-26: 0.4 mg via INTRAVENOUS

## 2013-09-26 MED ORDER — NEOSTIGMINE METHYLSULFATE 1 MG/ML IJ SOLN
INTRAMUSCULAR | Status: DC | PRN
  Administered 2013-09-26: 3 mg via INTRAVENOUS

## 2013-09-26 MED ORDER — OXYCODONE HCL 5 MG PO TABS
5.0000 mg | ORAL_TABLET | Freq: Once | ORAL | Status: AC | PRN
  Administered 2013-09-26: 5 mg via ORAL

## 2013-09-26 MED ORDER — OXYCODONE-ACETAMINOPHEN 5-325 MG PO TABS
1.0000 | ORAL_TABLET | ORAL | Status: DC | PRN
  Administered 2013-09-26 – 2013-09-27 (×2): 2 via ORAL
  Filled 2013-09-26 (×2): qty 2

## 2013-09-26 MED ORDER — HYDROMORPHONE HCL PF 1 MG/ML IJ SOLN
0.2500 mg | INTRAMUSCULAR | Status: DC | PRN
  Administered 2013-09-26 (×4): 0.5 mg via INTRAVENOUS

## 2013-09-26 MED ORDER — PHENYLEPHRINE 40 MCG/ML (10ML) SYRINGE FOR IV PUSH (FOR BLOOD PRESSURE SUPPORT)
PREFILLED_SYRINGE | INTRAVENOUS | Status: AC
  Filled 2013-09-26: qty 10

## 2013-09-26 MED ORDER — OXYCODONE HCL 5 MG PO TABS
5.0000 mg | ORAL_TABLET | ORAL | Status: DC | PRN
  Administered 2013-09-26: 10 mg via ORAL
  Administered 2013-09-26: 5 mg via ORAL
  Administered 2013-09-26 – 2013-09-27 (×4): 10 mg via ORAL
  Filled 2013-09-26 (×5): qty 2

## 2013-09-26 MED ORDER — DOCUSATE SODIUM 100 MG PO CAPS
100.0000 mg | ORAL_CAPSULE | Freq: Two times a day (BID) | ORAL | Status: DC
  Administered 2013-09-26 – 2013-09-27 (×3): 100 mg via ORAL
  Filled 2013-09-26 (×4): qty 1

## 2013-09-26 MED ORDER — MUPIROCIN 2 % EX OINT
TOPICAL_OINTMENT | CUTANEOUS | Status: AC
  Administered 2013-09-26: 1 via NASAL
  Filled 2013-09-26: qty 22

## 2013-09-26 MED ORDER — METOCLOPRAMIDE HCL 5 MG/ML IJ SOLN: 5.0000 mg | Freq: Three times a day (TID) | INTRAMUSCULAR | Status: DC | PRN

## 2013-09-26 MED ORDER — ONDANSETRON HCL 4 MG PO TABS: 4.0000 mg | ORAL_TABLET | Freq: Four times a day (QID) | ORAL | Status: DC | PRN

## 2013-09-26 MED ORDER — MUPIROCIN 2 % EX OINT
TOPICAL_OINTMENT | Freq: Two times a day (BID) | CUTANEOUS | Status: DC
  Administered 2013-09-26: 1 via NASAL
  Administered 2013-09-27: 08:00:00 via NASAL
  Filled 2013-09-26: qty 22

## 2013-09-26 MED ORDER — HYDROMORPHONE HCL PF 1 MG/ML IJ SOLN
0.5000 mg | INTRAMUSCULAR | Status: DC | PRN
  Administered 2013-09-26 (×2): 1 mg via INTRAVENOUS
  Filled 2013-09-26 (×2): qty 1

## 2013-09-26 MED ORDER — LACTATED RINGERS IV SOLN
INTRAVENOUS | Status: DC
  Administered 2013-09-26 (×2): via INTRAVENOUS

## 2013-09-26 MED ORDER — DEXAMETHASONE SODIUM PHOSPHATE 4 MG/ML IJ SOLN
INTRAMUSCULAR | Status: AC
  Filled 2013-09-26: qty 1

## 2013-09-26 MED ORDER — METHOCARBAMOL 100 MG/ML IJ SOLN
500.0000 mg | Freq: Four times a day (QID) | INTRAVENOUS | Status: DC | PRN
  Filled 2013-09-26: qty 10

## 2013-09-26 MED ORDER — PROPOFOL 10 MG/ML IV BOLUS
INTRAVENOUS | Status: AC
  Filled 2013-09-26: qty 20

## 2013-09-26 MED ORDER — GLYCOPYRROLATE 0.2 MG/ML IJ SOLN
INTRAMUSCULAR | Status: AC
  Filled 2013-09-26: qty 3

## 2013-09-26 SURGICAL SUPPLY — 78 items
BANDAGE ELASTIC 4 VELCRO ST LF (GAUZE/BANDAGES/DRESSINGS) IMPLANT
BANDAGE ELASTIC 6 VELCRO ST LF (GAUZE/BANDAGES/DRESSINGS) IMPLANT
BANDAGE ESMARK 6X9 LF (GAUZE/BANDAGES/DRESSINGS) ×1 IMPLANT
BANDAGE GAUZE ELAST BULKY 4 IN (GAUZE/BANDAGES/DRESSINGS) ×6 IMPLANT
BIT DRILL 2.5X110 QC LCP DISP (BIT) ×3 IMPLANT
BIT DRILL CANN 2.7X625 NONSTRL (BIT) ×3 IMPLANT
BIT DRILL QC 3.5X110 (BIT) ×3 IMPLANT
BLADE SURG 10 STRL SS (BLADE) ×3 IMPLANT
BNDG COHESIVE 4X5 TAN STRL (GAUZE/BANDAGES/DRESSINGS) ×3 IMPLANT
BNDG ESMARK 6X9 LF (GAUZE/BANDAGES/DRESSINGS) ×3
BNDG GAUZE ELAST 4 BULKY (GAUZE/BANDAGES/DRESSINGS) ×6 IMPLANT
BRUSH SCRUB DISP (MISCELLANEOUS) ×6 IMPLANT
COVER SURGICAL LIGHT HANDLE (MISCELLANEOUS) ×6 IMPLANT
DRAPE C-ARM 42X72 X-RAY (DRAPES) ×3 IMPLANT
DRAPE C-ARMOR (DRAPES) ×3 IMPLANT
DRAPE ORTHO SPLIT 77X108 STRL (DRAPES) ×6
DRAPE PROXIMA HALF (DRAPES) ×9 IMPLANT
DRAPE SURG ORHT 6 SPLT 77X108 (DRAPES) ×3 IMPLANT
DRAPE U-SHAPE 47X51 STRL (DRAPES) ×3 IMPLANT
DRSG ADAPTIC 3X8 NADH LF (GAUZE/BANDAGES/DRESSINGS) ×3 IMPLANT
DRSG EMULSION OIL 3X3 NADH (GAUZE/BANDAGES/DRESSINGS) IMPLANT
ELECT REM PT RETURN 9FT ADLT (ELECTROSURGICAL) ×3
ELECTRODE REM PT RTRN 9FT ADLT (ELECTROSURGICAL) ×1 IMPLANT
GLOVE BIO SURGEON STRL SZ7.5 (GLOVE) ×3 IMPLANT
GLOVE BIO SURGEON STRL SZ8 (GLOVE) ×3 IMPLANT
GLOVE BIOGEL PI IND STRL 7.5 (GLOVE) ×1 IMPLANT
GLOVE BIOGEL PI IND STRL 8 (GLOVE) ×1 IMPLANT
GLOVE BIOGEL PI INDICATOR 7.5 (GLOVE) ×2
GLOVE BIOGEL PI INDICATOR 8 (GLOVE) ×2
GOWN STRL REUS W/ TWL LRG LVL3 (GOWN DISPOSABLE) ×2 IMPLANT
GOWN STRL REUS W/ TWL XL LVL3 (GOWN DISPOSABLE) ×1 IMPLANT
GOWN STRL REUS W/TWL LRG LVL3 (GOWN DISPOSABLE) ×4
GOWN STRL REUS W/TWL XL LVL3 (GOWN DISPOSABLE) ×2
K-WIRE 1.25 TRCR POINT 150 (WIRE) ×6
KIT BASIN OR (CUSTOM PROCEDURE TRAY) ×3 IMPLANT
KIT ROOM TURNOVER OR (KITS) ×3 IMPLANT
KWIRE 1.25 TRCR POINT 150 (WIRE) ×2 IMPLANT
MANIFOLD NEPTUNE II (INSTRUMENTS) IMPLANT
NEEDLE HYPO 21X1.5 SAFETY (NEEDLE) IMPLANT
NEEDLE HYPO 25GX1X1/2 BEV (NEEDLE) ×3 IMPLANT
NS IRRIG 1000ML POUR BTL (IV SOLUTION) ×3 IMPLANT
PACK GENERAL/GYN (CUSTOM PROCEDURE TRAY) ×3 IMPLANT
PAD ARMBOARD 7.5X6 YLW CONV (MISCELLANEOUS) ×6 IMPLANT
PAD CAST 4YDX4 CTTN HI CHSV (CAST SUPPLIES) IMPLANT
PADDING CAST COTTON 4X4 STRL (CAST SUPPLIES)
PADDING CAST COTTON 6X4 STRL (CAST SUPPLIES) IMPLANT
PENCIL BUTTON HOLSTER BLD 10FT (ELECTRODE) ×3 IMPLANT
PLATE LCP 3.5 1/3 TUB 8HX93 (Plate) ×3 IMPLANT
SCREW CANC FT 4.0X20 (Screw) ×3 IMPLANT
SCREW CANC FT 4.0X22 (Screw) ×3 IMPLANT
SCREW CORTEX 3.5 14MM (Screw) ×4 IMPLANT
SCREW CORTEX 3.5 16MM (Screw) ×2 IMPLANT
SCREW CORTEX 3.5 24MM (Screw) ×2 IMPLANT
SCREW LOCK CORT ST 3.5X14 (Screw) ×2 IMPLANT
SCREW LOCK CORT ST 3.5X16 (Screw) ×1 IMPLANT
SCREW LOCK CORT ST 3.5X24 (Screw) ×1 IMPLANT
SPLINT PLASTER CAST XFAST 5X30 (CAST SUPPLIES) ×1 IMPLANT
SPLINT PLASTER XFAST SET 5X30 (CAST SUPPLIES) ×2
SPONGE GAUZE 4X4 12PLY (GAUZE/BANDAGES/DRESSINGS) ×3 IMPLANT
SPONGE LAP 18X18 X RAY DECT (DISPOSABLE) ×9 IMPLANT
SPONGE SCRUB IODOPHOR (GAUZE/BANDAGES/DRESSINGS) ×3 IMPLANT
STAPLER VISISTAT 35W (STAPLE) IMPLANT
SUCTION FRAZIER TIP 10 FR DISP (SUCTIONS) ×3 IMPLANT
SUT ETHILON 2 0 FS 18 (SUTURE) ×12 IMPLANT
SUT ETHILON 3 0 PS 1 (SUTURE) ×9 IMPLANT
SUT FIBERWIRE #2 38 T-5 BLUE (SUTURE) ×3
SUT PDS AB 2-0 CT1 27 (SUTURE) IMPLANT
SUT VIC AB 2-0 CT1 27 (SUTURE) ×8
SUT VIC AB 2-0 CT1 TAPERPNT 27 (SUTURE) ×4 IMPLANT
SUT VIC AB 2-0 CT3 27 (SUTURE) IMPLANT
SUTURE FIBERWR #2 38 T-5 BLUE (SUTURE) ×1 IMPLANT
SYR CONTROL 10ML LL (SYRINGE) IMPLANT
TOWEL OR 17X24 6PK STRL BLUE (TOWEL DISPOSABLE) ×3 IMPLANT
TOWEL OR 17X26 10 PK STRL BLUE (TOWEL DISPOSABLE) ×6 IMPLANT
TUBE CONNECTING 12'X1/4 (SUCTIONS) ×1
TUBE CONNECTING 12X1/4 (SUCTIONS) ×2 IMPLANT
UNDERPAD 30X30 INCONTINENT (UNDERPADS AND DIAPERS) ×3 IMPLANT
WATER STERILE IRR 1000ML POUR (IV SOLUTION) ×3 IMPLANT

## 2013-09-26 NOTE — Progress Notes (Signed)
Orthopedic Tech Progress Note Patient Details:  Levada SchillingMichael Ridgely 01-27-1991 [id number]  Patient ID: Levada SchillingMichael Perotti, male   DOB: 01-27-1991, 23 y.o.   MRN: [id number]   Mickie BailJennifer Carol Cammer 09/26/2013, 4:05 PMTrapeze bar

## 2013-09-26 NOTE — Anesthesia Procedure Notes (Signed)
Procedure Name: Intubation Date/Time: 09/26/2013 8:14 AM Performed by: Quentin OreWALKER, Srah Ake E Pre-anesthesia Checklist: Patient identified, Emergency Drugs available, Suction available and Patient being monitored Patient Re-evaluated:Patient Re-evaluated prior to inductionOxygen Delivery Method: Circle system utilized Preoxygenation: Pre-oxygenation with 100% oxygen Intubation Type: IV induction Ventilation: Mask ventilation without difficulty Laryngoscope Size: Mac and 4 Grade View: Grade I Tube type: Oral Tube size: 7.5 mm Number of attempts: 1 Airway Equipment and Method: Stylet Placement Confirmation: ETT inserted through vocal cords under direct vision,  positive ETCO2 and breath sounds checked- equal and bilateral Secured at: 23 cm Tube secured with: Tape Dental Injury: Teeth and Oropharynx as per pre-operative assessment

## 2013-09-26 NOTE — Transfer of Care (Signed)
Immediate Anesthesia Transfer of Care Note  Patient: Alvin SchillingMichael Parada  Procedure(s) Performed: Procedure(s): OPEN REDUCTION INTERNAL FIXATION (ORIF) RIGHT  ANKLE FRACTURE (Right)  Patient Location: PACU  Anesthesia Type:General  Level of Consciousness: awake, alert  and oriented  Airway & Oxygen Therapy: Patient Spontanous Breathing and Patient connected to nasal cannula oxygen  Post-op Assessment: Report given to PACU RN and Post -op Vital signs reviewed and stable  Post vital signs: Reviewed and stable  Complications: No apparent anesthesia complications

## 2013-09-26 NOTE — Progress Notes (Signed)
Care of pt assumed by MA Brigitta Pricer RN 

## 2013-09-26 NOTE — Brief Op Note (Signed)
09/26/2013  10:57 AM  PATIENT:  Alvin Webb  23 y.o. male  PRE-OPERATIVE DIAGNOSIS:  RIGHT TRIMALLELOR ANKLE FRACTURE  POST-OPERATIVE DIAGNOSIS:  RIGHT TRIMALLELOR ANKLE FRACTURE  PROCEDURE:  Procedure(s): OPEN REDUCTION INTERNAL FIXATION (ORIF) RIGHT  ANKLE FRACTURE (Right) trimalleoloar without fixation of posterior lip  SURGEON:  Surgeon(s) and Role:    * Budd PalmerMichael H Aashish Hamm, MD - Primary  PHYSICIAN ASSISTANT: Montez MoritaKeith Paul, PA-C  ANESTHESIA:   general  I/O:  Total I/O In: 1000 [I.V.:1000] Out: 25 [Blood:25]  SPECIMEN:  No Specimen  TOURNIQUET:  * No tourniquets in log *  DICTATION: .Other Dictation: Dictation Number [id number]

## 2013-09-26 NOTE — H&P (Signed)
I have seen and examined the patient. I agree with the findings above.  I discussed with the patient the risks and benefits of surgery, including the possibility of infection, nerve injury, vessel injury, malunion, nonunion, wound breakdown, arthritis, symptomatic hardware, DVT/ PE, loss of motion, and need for further surgery among others.  He understood these risks and wished to proceed.   Budd PalmerMichael H Dorinne Graeff, MD 09/26/2013 7:57 AM

## 2013-09-26 NOTE — Anesthesia Postprocedure Evaluation (Signed)
  Anesthesia Post-op Note  Patient: Alvin SchillingMichael Webb  Procedure(s) Performed: Procedure(s): OPEN REDUCTION INTERNAL FIXATION (ORIF) RIGHT  ANKLE FRACTURE (Right)  Patient Location: PACU  Anesthesia Type:General  Level of Consciousness: awake and sedated  Airway and Oxygen Therapy: Patient Spontanous Breathing  Post-op Pain: moderate  Post-op Assessment: Post-op Vital signs reviewed  Post-op Vital Signs: stable  Last Vitals:  Filed Vitals:   09/26/13 1200  BP:   Pulse: 77  Temp:   Resp: 16    Complications: No apparent anesthesia complications
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
## 2013-09-26 NOTE — Anesthesia Preprocedure Evaluation (Addendum)
Anesthesia Evaluation  Patient identified by MRN, date of birth, ID band Patient awake    Reviewed: Allergy & Precautions, H&P , NPO status   Airway Mallampati: II  Neck ROM: Full    Dental  (+) Teeth Intact   Pulmonary Current Smoker,  breath sounds clear to auscultation        Cardiovascular Rhythm:Regular Rate:Normal     Neuro/Psych    GI/Hepatic   Endo/Other  Morbid obesity  Renal/GU      Musculoskeletal   Abdominal   Peds  Hematology   Anesthesia Other Findings   Reproductive/Obstetrics                         Anesthesia Physical Anesthesia Plan  ASA: II  Anesthesia Plan: General   Post-op Pain Management:    Induction: Intravenous  Airway Management Planned: Oral ETT  Additional Equipment:   Intra-op Plan:   Post-operative Plan: Extubation in OR  Informed Consent: I have reviewed the patients History and Physical, chart, labs and discussed the procedure including the risks, benefits and alternatives for the proposed anesthesia with the patient or authorized representative who has indicated his/her understanding and acceptance.   Dental advisory given  Plan Discussed with: CRNA and Surgeon  Anesthesia Plan Comments:         Anesthesia Quick Evaluation

## 2013-09-27 MED ORDER — METHOCARBAMOL 500 MG PO TABS: 500.0000 mg | ORAL_TABLET | Freq: Four times a day (QID) | ORAL | Status: DC | PRN

## 2013-09-27 MED ORDER — ASPIRIN EC 325 MG PO TBEC: 325.0000 mg | DELAYED_RELEASE_TABLET | Freq: Every day | ORAL | Status: DC

## 2013-09-27 MED ORDER — OXYCODONE-ACETAMINOPHEN 5-325 MG PO TABS: 1.0000 | ORAL_TABLET | Freq: Four times a day (QID) | ORAL | Status: DC | PRN

## 2013-09-27 MED ORDER — OXYCODONE HCL 5 MG PO TABS: 5.0000 mg | ORAL_TABLET | ORAL | Status: DC | PRN

## 2013-09-27 NOTE — Discharge Instructions (Signed)
[facility]edic [facility]uma [facility] Discharge Instruction   General Discharge Instructions  WEIGHT BEARING STATUS: Nonweightbearing  RANGE OF MOTION/ACTIVITY:Activity as tolerated while maintaining weightbearing restrictions   Diet: as you were eating previously.  Can use over the counter stool softeners and bowel preparations, such as Miralax, to help with bowel movements.  Narcotics can be constipating.  Be sure to drink plenty of fluids  STOP SMOKING OR USING NICOTINE PRODUCTS!!!!  As discussed nicotine severely impairs your body's ability to heal surgical and traumatic wounds but also impairs bone healing.  Wounds and bone heal by forming microscopic blood vessels (angiogenesis) and nicotine is a vasoconstrictor (essentially, shrinks blood vessels).  Therefore, if vasoconstriction occurs to these microscopic blood vessels they essentially disappear and are unable to deliver necessary nutrients to the healing tissue.  This is one modifiable factor that you can do to dramatically increase your chances of healing your injury.    (This means no smoking, no nicotine gum, patches, etc)  DO NOT USE NONSTEROIDAL ANTI-INFLAMMATORY DRUGS (NSAID'S)  Using products such as Advil (ibuprofen), Aleve (naproxen), Motrin (ibuprofen) for additional pain control during fracture healing can delay and/or prevent the healing response.  If you would like to take over the counter (OTC) medication, Tylenol (acetaminophen) is ok.  However, some narcotic medications that are given for pain control contain acetaminophen as well. Therefore, you should not exceed more than 4000 mg of tylenol in a day if you do not have liver disease.  Also note that there are may OTC medicines, such as cold medicines and allergy medicines that my contain tylenol as well.  If you have any questions about medications and/or interactions please ask your doctor/PA or your pharmacist.   PAIN MEDICATION USE AND EXPECTATIONS  You have likely been given  narcotic medications to help control your pain.  After a traumatic event that results in an fracture (broken bone) with or without surgery, it is ok to use narcotic pain medications to help control one's pain.  We understand that everyone responds to pain differently and each individual patient will be evaluated on a regular basis for the continued need for narcotic medications. Ideally, narcotic medication use should last no more than 6-8 weeks (coinciding with fracture healing).   As a patient it is your responsibility as well to monitor narcotic medication use and report the amount and frequency you use these medications when you come to your office visit.   We would also advise that if you are using narcotic medications, you should take a dose prior to therapy to maximize you participation.  IF YOU ARE ON NARCOTIC MEDICATIONS IT IS NOT PERMISSIBLE TO OPERATE A MOTOR VEHICLE (MOTORCYCLE/CAR/TRUCK/MOPED) OR HEAVY MACHINERY DO NOT MIX NARCOTICS WITH OTHER CNS (CENTRAL NERVOUS SYSTEM) DEPRESSANTS SUCH AS ALCOHOL       ICE AND ELEVATE INJURED/OPERATIVE EXTREMITY  Using ice and elevating the injured extremity above your heart can help with swelling and pain control.  Icing in a pulsatile fashion, such as 20 minutes on and 20 minutes off, can be followed.    Do not place ice directly on skin. Make sure there is a barrier between to skin and the ice pack.    Using frozen items such as frozen peas works well as the conform nicely to the are that needs to be iced.  USE AN ACE WRAP OR TED HOSE FOR SWELLING CONTROL  In addition to icing and elevation, Ace wraps or TED hose are used to help limit and resolve swelling.  It is recommended to use Ace wraps or TED hose until you are informed to stop.    When using Ace Wraps start the wrapping distally (farthest away from the body) and wrap proximally (closer to the body)   Example: If you had surgery on your leg or thing and you do not have a splint on, start  the ace wrap at the toes and work your way up to the thigh        If you had surgery on your upper extremity and do not have a splint on, start the ace wrap at your fingers and work your way up to the upper arm  IF YOU ARE IN A SPLINT OR CAST DO NOT REMOVE IT FOR ANY REASON   If your splint gets wet for any reason please contact the office immediately. You may shower in your splint or cast as long as you keep it dry.  This can be done by wrapping in a cast cover or garbage back (or similar)  Do Not stick any thing down your splint or cast such as pencils, money, or hangers to try and scratch yourself with.  If you feel itchy take benadryl as prescribed on the bottle for itching  IF YOU ARE IN A CAM BOOT (BLACK BOOT)  You may remove boot periodically. Perform daily dressing changes as noted below.  Wash the liner of the boot regularly and wear a sock when wearing the boot. It is recommended that you sleep in the boot until told otherwise  CALL THE OFFICE WITH ANY QUESTIONS OR CONCERTS: [phone]     Discharge Pin Site Instructions  Dress pins daily with Kerlix roll starting on POD 2. Wrap the Kerlix so that it tamps the skin down around the pin-skin interface to prevent/limit motion of the skin relative to the pin.  (Pin-skin motion is the primary cause of pain and infection related to external fixator pin sites).  Remove any crust or coagulum that may obstruct drainage with a saline moistened gauze or soap and water.  After POD 3, if there is no discernable drainage on the pin site dressing, the interval for change can by increased to every other day.  You may shower with the fixator, cleaning all pin sites gently with soap and water.  If you have a surgical wound this needs to be completely dry and without drainage before showering.  The extremity can be lifted by the fixator to facilitate wound care and transfers.  Notify the office/Doctor if you experience increasing drainage,  redness, or pain from a pin site, or if you notice purulent (thick, snot-like) drainage.  Discharge Wound Care Instructions  Do NOT apply any ointments, solutions or lotions to pin sites or surgical wounds.  These prevent needed drainage and even though solutions like hydrogen peroxide kill bacteria, they also damage cells lining the pin sites that help fight infection.  Applying lotions or ointments can keep the wounds moist and can cause them to breakdown and open up as well. This can increase the risk for infection. When in doubt call the office.  Surgical incisions should be dressed daily.  If any drainage is noted, use one layer of adaptic, then gauze, Kerlix, and an ace wrap.  Once the incision is completely dry and without drainage, it may be left open to air out.  Showering may begin 36-48 hours later.  Cleaning gently with soap and water.  Traumatic wounds should be dressed daily as well.  One layer of adaptic, gauze, Kerlix, then ace wrap.  The adaptic can be discontinued once the draining has ceased    If you have a wet to dry dressing: wet the gauze with saline the squeeze as much saline out so the gauze is moist (not soaking wet), place moistened gauze over wound, then place a dry gauze over the moist one, followed by Kerlix wrap, then ace wrap.

## 2013-09-27 NOTE — Evaluation (Signed)
Occupational Therapy Evaluation Patient Details Name: Alvin Webb MRN: [id number] DOB: 1991-01-30 Today's Date: 09/27/2013    History of Present Illness Pt is a 23 y/o male who sustained a R trimalleolar ankle fracture 09/11/13. Pt presents s/p ORIF R ankle, and is NWB.    Clinical Impression   Patient evaluated by Occupational Therapy with no further acute OT needs identified. All education has been completed and the patient has no further questions. See below for any follow-up Occupational Therapy or equipment needs. OT to sign off. Thank you for referral.      Follow Up Recommendations  No OT follow up    Equipment Recommendations  None recommended by OT    Recommendations for Other Services       Precautions / Restrictions Precautions Precautions: Fall Restrictions RLE Weight Bearing: Non weight bearing      Mobility Bed Mobility Overal bed mobility: Modified Independent                Transfers Overall transfer level: Modified independent Equipment used: Crutches Transfers: Sit to/from Stand                Balance Overall balance assessment: Modified Independent                                          ADL Overall ADL's : Modified independent                                       General ADL Comments: pt demonstrates ability to reach BIL LE and educated on dressing Rt LE first. pt educated on type of clothing for d/c to help dress over cast size on RT LE. pt educated on shower setup and cover RT le to prevent cast becoming wet during shower. pt educated on elevation of Rt LE for edema management     Vision                     Perception     Praxis      Pertinent Vitals/Pain Controlled less than 5 out 10     Hand Dominance Right   Extremity/Trunk Assessment Upper Extremity Assessment Upper Extremity Assessment: Overall WFL for tasks assessed   Lower Extremity Assessment Lower Extremity  Assessment: Overall WFL for tasks assessed   Cervical / Trunk Assessment Cervical / Trunk Assessment: Normal   Communication Communication Communication: No difficulties   Cognition Arousal/Alertness: Awake/alert Behavior During Therapy: WFL for tasks assessed/performed Overall Cognitive Status: Within Functional Limits for tasks assessed                     General Comments       Exercises       Shoulder Instructions      Home Living Family/patient expects to be discharged to:: Private residence Living Arrangements: Spouse/significant other Available Help at Discharge: Family;Available 24 hours/day Type of Home: Apartment Home Access: Stairs to enter Entrance Stairs-Number of Steps: 48 Entrance Stairs-Rails: Right Home Layout: One level     Bathroom Shower/Tub: Chief Strategy Officer: Standard     Home Equipment: Crutches;Shower seat          Prior Functioning/Environment Level of Independence: Independent  OT Diagnosis:     OT Problem List:     OT Treatment/Interventions:      OT Goals(Current goals can be found in the care plan section)    OT Frequency:     Barriers to D/C:            Co-evaluation              End of Session Nurse Communication: Mobility status  Activity Tolerance: Patient tolerated treatment well Patient left: in chair;with call bell/phone within reach;with family/visitor present   Time: 1000-1017 OT Time Calculation (min): 17 min Charges:  OT General Charges $OT Visit: 1 Procedure OT Evaluation $Initial OT Evaluation Tier I: 1 Procedure G-Codes: OT G-codes **NOT FOR INPATIENT CLASS** Functional Assessment Tool Used: clincal observation Functional Limitation: Self care Self Care Current Status (Z6109(G8987): 0 percent impaired, limited or restricted Self Care Goal Status (U0454(G8988): 0 percent impaired, limited or restricted Self Care Discharge Status (U9811(G8989): 0 percent impaired, limited  or restricted  Harolyn RutherfordJessica B Rion Schnitzer 09/27/2013, 5:16 PM Pager: [phone]

## 2013-09-27 NOTE — Progress Notes (Signed)
Discharge instructions reviewed. Patient verbalizes understanding. Prescriptions given.IV out. Patient ready for discharge. Wife to drive patient home.

## 2013-09-27 NOTE — Progress Notes (Signed)
[facility]thopaedic Trauma Service (OTS)  Patient ID: Alvin Webb MRN: [id number] DOB/AGE: 1990-12-14 22 y.o.  Admit date: 09/26/2013 Discharge date: 09/27/2013  Admission Diagnoses:R ankle trimall  Discharge Diagnoses:  Active Problems:   Ankle fracture, right   Procedures Performed: ORIF R Ankle  Discharged Condition: good  Hospital Course: Admitted overnight for pain control post operatively.  Comminution found to be severe medially and post op pain necessitated IV narcotics. Improved today, POD 1, and deemed stable for d/c to home.  Consults: None  Significant Diagnostic Studies: radiology: post op xrays show acceptable alignment s/p suture repair of medial malleolus  Discharge Exam: RLE   No pain with passive stretch  Sens DPN, SPN, TN intact  Ext, flex great and lesser toes  Brisk CR, well controlled edema   Disposition: 01-Home or Self Care      Discharge Orders   Future Orders Complete By Expires   Call MD / Call 911  As directed    Constipation Prevention  As directed    Diet general  As directed    Discharge instructions  As directed    Driving restrictions  As directed    Increase activity slowly as tolerated  As directed    Non weight bearing  As directed    Questions:     Laterality:     Extremity:         Medication List         aspirin EC 325 MG tablet  Take 1 tablet (325 mg total) by mouth daily.     methocarbamol 500 MG tablet  Commonly known as:  ROBAXIN  Take 1-2 tablets (500-1,000 mg total) by mouth every 6 (six) hours as needed for muscle spasms.     oxyCODONE 5 MG immediate release tablet  Commonly known as:  Oxy IR/ROXICODONE  Take 1-2 tablets (5-10 mg total) by mouth every 3 (three) hours as needed for breakthrough pain.     oxyCODONE-acetaminophen 5-325 MG per tablet  Commonly known as:  PERCOCET/ROXICET  Take 1-2 tablets by mouth every 6 (six) hours as needed for severe pain.       Follow-up Information   Follow up with  Merced Hanners,Lewellyn H, MD. Schedule an appointment as soon as possible for a visit in 2 weeks.   Specialty:  Orthopedic Surgery   Contact information:   [address] [place] [id number] [phone]       Discharge Instructions and Plan: 1. NWB on RLE 2. F/u appt in 14 days for removal of sutures and cast change 3. Elevate, ice, wiggle toes 4. Percocet, oxycodone 5. ECASA daily  Signed:  Myrene GalasMichael Andrey Mccaskill, MD [facility] [phone] (p)   09/27/2013, 9:34 AM

## 2013-09-27 NOTE — Op Note (Signed)
[name], MASTERSON               ACCOUNT NO.:  [id number]  MEDICAL RECORD NO.:  [id number]  LOCATION:  5N16C                        FACILITY:  [facility]  PHYSICIAN:  Rayburn Mundis. Carola Frost, M.D. DATE OF BIRTH:  13-Jan-1991  DATE OF PROCEDURE:  09/26/2013 DATE OF DISCHARGE:                              OPERATIVE REPORT   PREOPERATIVE DIAGNOSIS:  Right trimalleolar ankle fracture.  POSTOPERATIVE DIAGNOSIS:  Right trimalleolar ankle fracture.  PROCEDURE:  ORIF of right trimalleolar ankle fracture without fixation of the posterior lip.  SURGEON:  Greydon Betke. Carola Frost, M.D.  ASSISTANT:  Mearl Latin, PA-C  ANESTHESIA:  General.  COMPLICATIONS:  None.  TOURNIQUET:  None.  I/O:  1000 mL of crystalloid, EBL 25 mL.  DISPOSITION:  PACU.  CONDITION:  Stable.  BRIEF SUMMARY AND INDICATION FOR PROCEDURE:  Alvin Webb is a 23 year old male with fracture dislocation of the right ankle, treated with closed reduction.  He did have rather severe abrasions directly over the medial malleolus as well as the medial first metatarsophalangeal joint. I have seen and evaluated him in clinic twice with re-reduction of the ankle at his first followup and then reassessment of the soft tissues at the second followup.  At this time, he presents for definitive internal fixation.  I discussed with him the risks and benefits of surgical treatment including the possibility of infection, nerve injury, vessel injury, DVT, PE, heart attack, stroke, wound breakdown particularly elevated in this case because of his soft tissue injuries, the need for further surgery, arthritis, loss of motion, and many others.  The patient understood these risks and did wish to proceed.  BRIEF DESCRIPTION OF PROCEDURE:  Alvin Webb was given preoperative antibiotics, taken to the operating room where general anesthesia was induced.  His right lower extremity was prepped and draped in the usual sterile fashion.  No tourniquet was used  during the procedure.  A standard lateral approach was made to the fibula where dissection was carried down to the fracture.  Periosteum was left intact except at the fracture edge.  The site itself was opened to allow for clean out of the fracture site with curette and lavage.  A reduction maneuver was then performed, which consisted of traction and internal rotation.  I was able to interdigitate the fragment and place a lag screw from anterior to posterior drilling the near cortex and securing the far cortex. Multiple C-arm images showed anatomic reduction of the fibula in an additional buttressing fashion.  A screw was then placed into an 8-hole plate and from the posterolateral aspect of the fibula just proximal to the fracture site the plate came up and compressed against the fracture applying reduction of force and buttress to re- displacement.  It was secured distally with cancellous screws and proximally with cortical screws.  Attention was then turned to the medial side.  Because of the distal, commminuted fracture and a large overlying soft tissue injury, options for treatment here were very limited. Instead of a formal hockey-stick anteromedial incision that would allow for both reduction and instrumentation, initially began with attempted percutaneous reduction placing 2 small vertical incisions distal to the medial malleolus.  This, however, instead revealed  that there was a coronal as well as a sagittal split to the medial malleolus and it was not amendable to standard screw fixation.  Consequently at that point, I extended the more posterior of the incisions proximally going posterior to the traumatic wound and abrasion.  I did not go directly underneath and undermine this tissue in any way.  A figure-of-eight tension band suture repair was then performed with a #2 FiberWire using a bone tunnel.  Bone tunnel was established with a 2.5 drill and a Kessler type construct  around the ligaments distal to the medial malleolus.  This appeared to produce anatomic reduction, and wound was irrigated on both sides and closed in standard layered fashion with 2-0 Vicryl and 3-0 nylon.  Again, AP mortise and lateral films were obtained to evaluate both fractures.  I did use some oblique images as well.  Montez MoritaKeith Paul, PA- C, assisted me throughout and was absolutely necessary for the safe and effective completion, particularly given the severity of the comminution, difficulty medially.  We were also able to work in and closed the incision simultaneously.  He was then placed into a well- padded posterior stirrup splint, which again required an assistant.  He was then awakened from anesthesia and transported to PACU in stable condition.  PROGNOSIS:  Alvin Webb will need cast immobilization for at least 4 weeks and possibly full 6 to 8 to protect his medial malleolar repair, which would be prone to loss of reduction in either abduction or external rotation.  We were very pleased with the reduction, but it is not as robust a construct as we are able to typically place.  The posterior malleolar segment was in a proximal position and did not constitute more than 10-15% of the articular surface and offered no benefit to reduction in separate internal fixation.  He will have DVT prophylaxis with Lovenox or aspirin depending on his ability to obtain these medications.  Continue with elevation and frequent toe motion.  We expect a 6- to 8-week period of immobilization depending upon x-rays and clinical examination before allowing weightbearing.     Doralee AlbinoMichael H. Carola FrostHandy, M.D.     M[facility]/MEDQ  D:  09/26/2013  T:  09/26/2013  Job:  [id number]

## 2013-09-27 NOTE — Progress Notes (Signed)
Orthopaedic Trauma Service (OTS)  Subjective: 1 Day Post-Op Procedure(s) (LRB): OPEN REDUCTION INTERNAL FIXATION (ORIF) RIGHT  ANKLE FRACTURE (Right) Patient reports pain as moderate.   Severe overnight but improved with IV meds.   Objective: Current Vitals Blood pressure 154/94, pulse 79, temperature 98.7 F (37.1 C), temperature source Oral, resp. rate 16, height 5' 9.02" (1.753 m), weight 207 lb (93.895 kg), SpO2 100.00%. Vital signs in last 24 hours: Temp:  [97.6 F (36.4 C)-98.7 F (37.1 C)] 98.7 F (37.1 C) (04/11 0615) Pulse Rate:  [75-94] 79 (04/11 0615) Resp:  [11-22] 16 (04/11 0615) BP: (99-174)/(57-94) 154/94 mmHg (04/11 0615) SpO2:  [99 %-100 %] 100 % (04/11 0615) Weight:  [207 lb (93.895 kg)] 207 lb (93.895 kg) (04/10 2222)  Intake/Output from previous day: 04/10 0701 - 04/11 0700 In: 1532 [P.O.:262; I.V.:1220; IV Piggyback:50] Out: 625 [Urine:600; Blood:25]  LABS  Recent Labs  09/25/13 1400 09/26/13 1150  HGB 14.9 13.7    Recent Labs  09/25/13 1400 09/26/13 1150  WBC 10.3 14.6*  RBC 5.18 4.82  HCT 44.1 41.0  PLT 352 313    Recent Labs  09/25/13 1400 09/26/13 1150  NA 142  --   K 4.3  --   CL 101  --   CO2 26  --   BUN 9  --   CREATININE 0.96 0.91  GLUCOSE 73  --   CALCIUM 9.7  --    No results found for this basename: LABPT, INR,  in the last 72 hours  Physical Exam  RLE  No pain with passive stretch  Sens DPN, SPN, TN intact  Ext, flex great and lesser toes  Brisk CR, well controlled edema   Imaging Dg Ankle Complete Right  09/26/2013   CLINICAL DATA:  ORIF right ankle fracture  EXAM: RIGHT ANKLE - COMPLETE 3+ VIEW; DG C-ARM 1-60 MIN  FLUOROSCOPY TIME:  46 seconds  COMPARISON:  DG ANKLE COMPLETE*R* dated 09/12/2013  FINDINGS: Three spot intraoperative fluoroscopic images of the right ankle are provided for review.  Images demonstrate the sequela of a sideplate fixation of previously noted displaced distal fibular fracture.  There is an additional transfixing cancellous screw about the main fracture site.  There is improved alignment of previously noted minimally displaced medial malleolar fracture without dedicated operative intervention. There is persistent mild displacement of known posterior malleoli fracture.  There is is a minimal amount of expected subcutaneous emphysema about the operative site. No radiopaque foreign body.  IMPRESSION: Post sideplate fixation of distal fibular fracture without evidence of complication.   Electronically Signed   By: Simonne Come M.D.   On: 09/26/2013 12:31   Dg Ankle Right Port  09/26/2013   CLINICAL DATA:  ORIF right ankle fracture.  EXAM: PORTABLE RIGHT ANKLE - 2 VIEW  COMPARISON:  DG ANKLE COMPLETE*R* dated 09/26/2013; DG ANKLE COMPLETE*R* dated 09/12/2013; DG ANKLE COMPLETE*R* dated 09/12/2013  FINDINGS: Examination performed in plaster cast material demonstrates ORIF of the distal fibular fracture with plate and screw fixation. Alignment appears anatomic. The transverse fracture of the medial malleolus can the vertical fracture of the posterior malleolus are again noted. Ankle mortise intact.  IMPRESSION: Anatomic alignment after ORIF of the distal fibular fracture. The fractures of the medial malleolus and posterior malleolus are again noted. Ankle mortise intact.   Electronically Signed   By: Hulan Saas M.D.   On: 09/26/2013 14:01   Dg C-arm 1-60 Min  09/26/2013   CLINICAL DATA:  ORIF right ankle fracture  EXAM: RIGHT ANKLE - COMPLETE 3+ VIEW; DG C-ARM 1-60 MIN  FLUOROSCOPY TIME:  46 seconds  COMPARISON:  DG ANKLE COMPLETE*R* dated 09/12/2013  FINDINGS: Three spot intraoperative fluoroscopic images of the right ankle are provided for review.  Images demonstrate the sequela of a sideplate fixation of previously noted displaced distal fibular fracture. There is an additional transfixing cancellous screw about the main fracture site.  There is improved alignment of previously noted  minimally displaced medial malleolar fracture without dedicated operative intervention. There is persistent mild displacement of known posterior malleoli fracture.  There is is a minimal amount of expected subcutaneous emphysema about the operative site. No radiopaque foreign body.  IMPRESSION: Post sideplate fixation of distal fibular fracture without evidence of complication.   Electronically Signed   By: Simonne ComeJohn  Watts M.D.   On: 09/26/2013 12:31    Assessment/Plan: 1 Day Post-Op Procedure(s) (LRB): OPEN REDUCTION INTERNAL FIXATION (ORIF) RIGHT  ANKLE FRACTURE (Right) D/c to home today.  Should not need formal PT.  1. NWB on RLE  2. F/u appt in 14 days for removal of sutures and cast change  3. Elevate, ice, wiggle toes  4. Percocet, oxycodone  5. ECASA daily  Myrene GalasMichael Travious Vanover, MD [facility] [phone] (p)   09/27/2013, 9:08 AM

## 2013-09-29 ENCOUNTER — Encounter (HOSPITAL_COMMUNITY): Payer: Self-pay | Admitting: Orthopedic Surgery

## 2014-04-05 ENCOUNTER — Encounter (HOSPITAL_COMMUNITY): Payer: Self-pay | Admitting: Emergency Medicine

## 2014-04-05 ENCOUNTER — Emergency Department (HOSPITAL_COMMUNITY)
Admission: EM | Admit: 2014-04-05 | Discharge: 2014-04-05 | Disposition: A | Discharge status: Home / Self Care | Payer: Self-pay | Attending: Emergency Medicine | Admitting: Emergency Medicine

## 2014-04-05 DIAGNOSIS — Z7952 Long term (current) use of systemic steroids: Secondary | ICD-10-CM | POA: Insufficient documentation

## 2014-04-05 DIAGNOSIS — J45901 Unspecified asthma with (acute) exacerbation: Secondary | ICD-10-CM | POA: Insufficient documentation

## 2014-04-05 DIAGNOSIS — J988 Other specified respiratory disorders: Secondary | ICD-10-CM

## 2014-04-05 DIAGNOSIS — B9789 Other viral agents as the cause of diseases classified elsewhere: Secondary | ICD-10-CM

## 2014-04-05 DIAGNOSIS — Z720 Tobacco use: Secondary | ICD-10-CM | POA: Insufficient documentation

## 2014-04-05 DIAGNOSIS — J069 Acute upper respiratory infection, unspecified: Secondary | ICD-10-CM | POA: Insufficient documentation

## 2014-04-05 DIAGNOSIS — J9801 Acute bronchospasm: Secondary | ICD-10-CM

## 2014-04-05 MED ORDER — HYDROCOD POLST-CHLORPHEN POLST 10-8 MG/5ML PO LQCR: 5.0000 mL | Freq: Two times a day (BID) | ORAL | Status: DC | PRN

## 2014-04-05 MED ORDER — ALBUTEROL SULFATE HFA 108 (90 BASE) MCG/ACT IN AERS
2.0000 | INHALATION_SPRAY | Freq: Once | RESPIRATORY_TRACT | Status: AC
  Administered 2014-04-05: 2 via RESPIRATORY_TRACT
  Filled 2014-04-05: qty 6.7

## 2014-04-05 MED ORDER — PREDNISONE (PAK) 10 MG PO TABS: ORAL_TABLET | Freq: Every day | ORAL | Status: DC

## 2014-04-05 NOTE — ED Provider Notes (Signed)
CSN: [id number]     Arrival date & time 04/05/14  1305 History  This chart was scribed for Trixie DredgeEmily Ladarrious Kirksey, PA-C, working with Gwyneth SproutWhitney Plunkett, MD by Chestine SporeSoijett Blue, ED Scribe. The patient was seen in room TR11C/TR11C at 2:14 PM.      Chief Complaint  Patient presents with  . Sore Throat  . Cough  . Nasal Congestion     The history is provided by the patient. No language interpreter was used.   Alvin SchillingMichael Hochmuth is a 23 y.o. male who presents today complaining of sore throat, cough, and congestion onset yesterday. He states that when he coughs or sneezes his whole body will "sting". He states that he has a productive cough producing phlegm. He states that yesterday when he coughed there was a streak of blood in the phlegm. He states that he is having associated symptoms of rhinorrhea, ear fullness, SOB, chills, body aches, loose stool. He states that he has tried [employer]ra with no relief for his symptoms. He denies throat swelling, fever, leg swelling, urinary problems. He denies DM. He states that he has a medical hx of asthma. He states that he heard himself wheezing while in the ED. He denies having an inhaler. He states that he does smoke.  Denies recent immobilization or leg swelling.    Past Medical History  Diagnosis Date  . Asthma     as child   Past Surgical History  Procedure Laterality Date  . No past surgeries    . Orif ankle fracture Right 09/26/2013    Procedure: OPEN REDUCTION INTERNAL FIXATION (ORIF) RIGHT  ANKLE FRACTURE;  Surgeon: Budd PalmerMichael H Handy, MD;  Location: [facility] OR;  Service: Orthopedics;  Laterality: Right;   No family history on file. History  Substance Use Topics  . Smoking status: Current Every Day Smoker -- 1.00 packs/day for 4 years  . Smokeless tobacco: Not on file  . Alcohol Use: Yes     Comment: weekly    Review of Systems  HENT: Positive for congestion and sore throat.   Respiratory: Positive for cough.   All other systems reviewed and are  negative.     Allergies  Review of patient's allergies indicates no known allergies.  Home Medications   Prior to Admission medications   Medication Sig Start Date End Date Taking? Authorizing Provider  aspirin EC 325 MG tablet Take 1 tablet (325 mg total) by mouth daily. 09/27/13   Mearl LatinKeith W Paul, PA-C  methocarbamol (ROBAXIN) 500 MG tablet Take 1-2 tablets (500-1,000 mg total) by mouth every 6 (six) hours as needed for muscle spasms. 09/27/13   Mearl LatinKeith W Paul, PA-C  oxyCODONE (OXY IR/ROXICODONE) 5 MG immediate release tablet Take 1-2 tablets (5-10 mg total) by mouth every 3 (three) hours as needed for breakthrough pain. 09/27/13   Mearl LatinKeith W Paul, PA-C  oxyCODONE-acetaminophen (PERCOCET/ROXICET) 5-325 MG per tablet Take 1-2 tablets by mouth every 6 (six) hours as needed for severe pain. 09/27/13   Mearl LatinKeith W Paul, PA-C   BP 143/79  Pulse 71  Temp(Src) 98.9 F (37.2 C) (Oral)  Resp 16  SpO2 100% Physical Exam  Nursing note and vitals reviewed. Constitutional: He appears well-developed and well-nourished. No distress.  HENT:  Head: Normocephalic and atraumatic.  Right Ear: Tympanic membrane and ear canal normal.  Left Ear: Tympanic membrane and ear canal normal.  Nose: Rhinorrhea present.  Mouth/Throat: Oropharynx is clear and moist.  Mild tenderness of the bilateral maxillary sinuses. Rhinorrhea present.  Neck: Neck supple.  Cardiovascular: Normal rate and regular rhythm.   Pulmonary/Chest: Effort normal. No accessory muscle usage. Not tachypneic. No respiratory distress. He has no decreased breath sounds. He has wheezes. He has no rhonchi. He has no rales.  Mild diffuse wheezes bilaterally.   Abdominal: Soft. He exhibits no distension and no mass. There is no tenderness. There is no rebound and no guarding.  Lymphadenopathy:    He has no cervical adenopathy.  Neurological: He is alert. He exhibits normal muscle tone.  Skin: He is not diaphoretic.    ED Course  Procedures (including  critical care time) DIAGNOSTIC STUDIES: Oxygen Saturation is 100% on room air, normal by my interpretation.    COORDINATION OF CARE: 2:22 PM-Discussed treatment plan with pt at bedside and pt agreed to plan.   Labs Review Labs Reviewed - No data to display  Imaging Review No results found.   EKG Interpretation None        MDM   Final diagnoses:  Viral respiratory illness  Bronchospasm    Afebrile, nontoxic patient with constellation of symptoms suggestive of viral syndrome.  Slight blood tinged sputum but pt has been coughing and is a smoker.  Mild diffuse wheezes on exam.  Pt denies SOB.  Wells criteria for PE is 1 point, low risk group.  Given constellation of symptoms, suspect viral respiratory illness with mild asthma exacerbation.  Doubt pulmonary embolism, pneumonia, pneumothorax.  Discharged home with supportive care, PCP follow up. Discussed result, findings, treatment, and follow up  with patient.  Pt given return precautions.  Pt verbalizes understanding and agrees with plan.      I personally performed the services described in this documentation, which was scribed in my presence. The recorded information has been reviewed and is accurate.    Trixie Dredgemily Kinze Labo, PA-C 04/05/14 1453

## 2014-04-05 NOTE — Discharge Instructions (Signed)
Read the information below.  Use the prescribed medication as directed.  Please discuss all new medications with your pharmacist.  You may return to the Emergency Department at any time for worsening condition or any new symptoms that concern you.  If you develop high fevers that do not resolve with tylenol or ibuprofen, you have difficulty swallowing or breathing, or you are unable to tolerate fluids by mouth, return to the ER for a recheck.       Viral Infections A virus is a type of germ. Viruses can cause:  Minor sore throats.  Aches and pains.  Headaches.  Runny nose.  Rashes.  Watery eyes.  Tiredness.  Coughs.  Loss of appetite.  Feeling sick to your stomach (nausea).  Throwing up (vomiting).  Watery poop (diarrhea). HOME CARE   Only take medicines as told by your doctor.  Drink enough water and fluids to keep your pee (urine) clear or pale yellow. Sports drinks are a good choice.  Get plenty of rest and eat healthy. Soups and broths with crackers or rice are fine. GET HELP RIGHT AWAY IF:   You have a very bad headache.  You have shortness of breath.  You have chest pain or neck pain.  You have an unusual rash.  You cannot stop throwing up.  You have watery poop that does not stop.  You cannot keep fluids down.  You or your child has a temperature by mouth above 102 F (38.9 C), not controlled by medicine.  Your baby is older than 3 months with a rectal temperature of 102 F (38.9 C) or higher.  Your baby is 663 months old or younger with a rectal temperature of 100.4 F (38 C) or higher. MAKE SURE YOU:   Understand these instructions.  Will watch this condition.  Will get help right away if you are not doing well or get worse. Document Released: 05/18/2008 Document Revised: 08/28/2011 Document Reviewed: 10/11/2010 [facility] Patient Information 2015 [place], [place]. This information is not intended to replace advice given to you by your health  care provider. Make sure you discuss any questions you have with your health care provider.  Bronchospasm A bronchospasm is when the tubes that carry air in and out of your lungs (airways) spasm or tighten. During a bronchospasm it is hard to breathe. This is because the airways get smaller. A bronchospasm can be triggered by:  Allergies. These may be to animals, pollen, food, or mold.  Infection. This is a common cause of bronchospasm.  Exercise.  Irritants. These include pollution, cigarette smoke, strong odors, aerosol sprays, and paint fumes.  Weather changes.  Stress.  Being emotional. HOME CARE   Always have a plan for getting help. Know when to call your doctor and local emergency services (911 in the [place] Know where you can get emergency care.  Only take medicines as told by your doctor.  If you were prescribed an inhaler or nebulizer machine, ask your doctor how to use it correctly. Always use a spacer with your inhaler if you were given one.  Stay calm during an attack. Try to relax and breathe more slowly.  Control your home environment:  Change your heating and air conditioning filter at least once a month.  Limit your use of fireplaces and wood stoves.  Do not  smoke. Do not  allow smoking in your home.  Avoid perfumes and fragrances.  Get rid of pests (such as roaches and mice) and their droppings.  Throw away plants if you see mold on them.  Keep your house clean and dust free.  Replace carpet with wood, tile, or vinyl flooring. Carpet can trap dander and dust.  Use allergy-proof pillows, mattress covers, and box spring covers.  Wash bed sheets and blankets every week in hot water. Dry them in a dryer.  Use blankets that are made of polyester or cotton.  Wash hands frequently. GET HELP IF:  You have muscle aches.  You have chest pain.  The thick spit you spit or cough up (sputum) changes from clear or white to yellow, green, gray, or  bloody.  The thick spit you spit or cough up gets thicker.  There are problems that may be related to the medicine you are given such as:  A rash.  Itching.  Swelling.  Trouble breathing. GET HELP RIGHT AWAY IF:  You feel you cannot breathe or catch your breath.  You cannot stop coughing.  Your treatment is not helping you breathe better.  You have very bad chest pain. MAKE SURE YOU:   Understand these instructions.  Will watch your condition.  Will get help right away if you are not doing well or get worse. Document Released: 04/02/2009 Document Revised: 06/10/2013 Document Reviewed: 11/26/2012 [facility] Patient Information 2015 [place], [place]. This information is not intended to replace advice given to you by your health care provider. Make sure you discuss any questions you have with your health care provider.

## 2014-04-05 NOTE — ED Notes (Signed)
Pt. Stated, I've got a sore throat, cough, and congestion since yesterday.

## 2014-04-05 NOTE — ED Provider Notes (Signed)
Medical screening examination/treatment/procedure(s) were performed by non-physician practitioner and as supervising physician I was immediately available for consultation/collaboration.   EKG Interpretation None        Janai Maudlin, MD 04/05/14 1603 

## 2015-01-20 ENCOUNTER — Emergency Department (HOSPITAL_COMMUNITY)
Admission: EM | Admit: 2015-01-20 | Discharge: 2015-01-20 | Disposition: A | Discharge status: Home / Self Care | Payer: Self-pay | Attending: Emergency Medicine | Admitting: Emergency Medicine

## 2015-01-20 ENCOUNTER — Encounter (HOSPITAL_COMMUNITY): Payer: Self-pay | Admitting: Family Medicine

## 2015-01-20 DIAGNOSIS — Z7982 Long term (current) use of aspirin: Secondary | ICD-10-CM | POA: Insufficient documentation

## 2015-01-20 DIAGNOSIS — Z720 Tobacco use: Secondary | ICD-10-CM | POA: Insufficient documentation

## 2015-01-20 DIAGNOSIS — J029 Acute pharyngitis, unspecified: Secondary | ICD-10-CM | POA: Insufficient documentation

## 2015-01-20 DIAGNOSIS — J45909 Unspecified asthma, uncomplicated: Secondary | ICD-10-CM | POA: Insufficient documentation

## 2015-01-20 LAB — RAPID STREP SCREEN (MED CTR MEBANE ONLY): Streptococcus, Group A Screen (Direct): NEGATIVE

## 2015-01-20 MED ORDER — AMOXICILLIN 500 MG PO CAPS: 500.0000 mg | ORAL_CAPSULE | Freq: Three times a day (TID) | ORAL | Status: DC

## 2015-01-20 NOTE — ED Provider Notes (Signed)
CSN: [id number]     Arrival date & time 01/20/15  0910 History   This chart was scribed for non-physician practitioner, Langston Masker, PA-C, working with Blane Ohara, MD by Charline Bills, ED Scribe. This patient was seen in room TR08C/TR08C and the patient's care was started at 9:54 AM.   Chief Complaint  Patient presents with  . Sore Throat   The history is provided by the patient. No language interpreter was used.   HPI Comments: Alvin Webb is a 24 y.o. male who presents to the Emergency Department complaining of a gradually worsening sore throat onset 4 days ago, worsened over the past 2 days. 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 He reports constant, moderate sore throat that is exacerbated with swallowing. No treatments tried PTA. He denies postnasal drip, cough, ear pain. No known medical allergies.   Past Medical History  Diagnosis Date  . Asthma     as child   Past Surgical History  Procedure Laterality Date  . No past surgeries    . Orif ankle fracture Right 09/26/2013    Procedure: OPEN REDUCTION INTERNAL FIXATION (ORIF) RIGHT  ANKLE FRACTURE;  Surgeon: Budd Palmer, MD;  Location: [facility] OR;  Service: Orthopedics;  Laterality: Right;   History reviewed. No pertinent family history. History  Substance Use Topics  . Smoking status: Current Every Day Smoker -- 1.00 packs/day for 4 years  . Smokeless tobacco: Not on file  . Alcohol Use: Yes     Comment: weekly    Review of Systems  HENT: Positive for sore throat. Negative for ear pain and postnasal drip.   Respiratory: Negative for cough.    Allergies  Review of patient's allergies indicates no known allergies.  Home Medications   Prior to Admission medications   Medication Sig Start Date End Date Taking? Authorizing Provider  aspirin EC 325 MG tablet Take 1 tablet (325 mg total) by mouth daily. 09/27/13   Montez Morita, PA-C  chlorpheniramine-HYDROcodone [facility])  10-8 MG/5ML LQCR Take 5 mLs by mouth every 12 (twelve) hours as needed for cough. 04/05/14   Trixie Dredge, PA-C  methocarbamol (ROBAXIN) 500 MG tablet Take 1-2 tablets (500-1,000 mg total) by mouth every 6 (six) hours as needed for muscle spasms. 09/27/13   Montez Morita, PA-C  oxyCODONE (OXY IR/ROXICODONE) 5 MG immediate release tablet Take 1-2 tablets (5-10 mg total) by mouth every 3 (three) hours as needed for breakthrough pain. 09/27/13   Montez Morita, PA-C  oxyCODONE-acetaminophen (PERCOCET/ROXICET) 5-325 MG per tablet Take 1-2 tablets by mouth every 6 (six) hours as needed for severe pain. 09/27/13   Montez Morita, PA-C  predniSONE (STERAPRED UNI-PAK) 10 MG tablet Take by mouth daily. Day 1: take 6 tabs.  Day 2: 5 tabs  Day 3: 4 tabs  Day 4: 3 tabs  Day 5: 2 tabs  Day 6: 1 tab 04/05/14   Trixie Dredge, PA-C   BP 148/75 mmHg  Pulse 66  Temp(Src) 97.4 F (36.3 C) (Oral)  Resp 18  SpO2 98% Physical Exam  Constitutional: He is oriented to person, place, and time. He appears well-developed and well-nourished. No distress.  HENT:  Head: Normocephalic and atraumatic.  Mouth/Throat: Posterior oropharyngeal erythema present. No oropharyngeal exudate.  Mouth/Throat: Injected  Eyes: Conjunctivae and EOM are normal.  Neck: Neck supple. No tracheal deviation present.  Cardiovascular: Normal rate.   Pulmonary/Chest: Effort normal. No respiratory distress.  Musculoskeletal: Normal range of  motion.  Lymphadenopathy:    He has cervical adenopathy (diffuse).  Neurological: He is alert and oriented to person, place, and time.  Skin: Skin is warm and dry.  Psychiatric: He has a normal mood and affect. His behavior is normal.  Nursing note and vitals reviewed.  ED Course  Procedures (including critical care time) DIAGNOSTIC STUDIES: Oxygen Saturation is 98% on RA, normal by my interpretation.    COORDINATION OF CARE: 9:56 AM-Discussed treatment plan which includes strep screen/culture and Amoxil with pt at  bedside and pt agreed to plan.   Labs Review Labs Reviewed  RAPID STREP SCREEN (NOT AT [facility])  CULTURE, GROUP A STREP   Imaging Review No results found.   EKG Interpretation None      MDM   Final diagnoses:  Pharyngitis    amoxicillian avs  I personally performed the services in this documentation, which was scribed in my presence.  The recorded information has been reviewed and considered.   Barnet Pall.   Lonia Skinner Marland, PA-C 01/20/15 1610  Blane Ohara, MD 01/23/15 [phone]

## 2015-01-20 NOTE — Discharge Instructions (Signed)

## 2015-01-20 NOTE — ED Notes (Signed)
Pt here for sore throat, started 3 days ago and worse. Hurts to swallow. Hx of strep

## 2015-01-20 NOTE — ED Notes (Signed)
Declined W/C at D/C and was escorted to lobby by RN. 

## 2015-01-23 LAB — CULTURE, GROUP A STREP

## 2015-02-23 ENCOUNTER — Encounter (HOSPITAL_COMMUNITY): Payer: Self-pay | Admitting: Emergency Medicine

## 2015-02-23 ENCOUNTER — Emergency Department (HOSPITAL_COMMUNITY)
Admission: EM | Admit: 2015-02-23 | Discharge: 2015-02-23 | Disposition: A | Discharge status: Home / Self Care | Payer: Self-pay | Attending: Emergency Medicine | Admitting: Emergency Medicine

## 2015-02-23 DIAGNOSIS — R1010 Upper abdominal pain, unspecified: Secondary | ICD-10-CM

## 2015-02-23 DIAGNOSIS — R197 Diarrhea, unspecified: Secondary | ICD-10-CM

## 2015-02-23 DIAGNOSIS — A084 Viral intestinal infection, unspecified: Secondary | ICD-10-CM | POA: Insufficient documentation

## 2015-02-23 DIAGNOSIS — Z720 Tobacco use: Secondary | ICD-10-CM | POA: Insufficient documentation

## 2015-02-23 DIAGNOSIS — J45909 Unspecified asthma, uncomplicated: Secondary | ICD-10-CM | POA: Insufficient documentation

## 2015-02-23 LAB — CBC
HCT: 44.2 % (ref 39.0–52.0)
HEMOGLOBIN: 14.5 g/dL (ref 13.0–17.0)
MCH: 28 pg (ref 26.0–34.0)
MCHC: 32.8 g/dL (ref 30.0–36.0)
MCV: 85.5 fL (ref 78.0–100.0)
PLATELETS: 289 10*3/uL (ref 150–400)
RBC: 5.17 MIL/uL (ref 4.22–5.81)
RDW: 13.9 % (ref 11.5–15.5)
WBC: 8.8 10*3/uL (ref 4.0–10.5)

## 2015-02-23 LAB — COMPREHENSIVE METABOLIC PANEL
ALT: 32 U/L (ref 17–63)
ANION GAP: 5 (ref 5–15)
AST: 34 U/L (ref 15–41)
Albumin: 3.9 g/dL (ref 3.5–5.0)
Alkaline Phosphatase: 80 U/L (ref 38–126)
BUN: 9 mg/dL (ref 6–20)
CHLORIDE: 106 mmol/L (ref 101–111)
CO2: 28 mmol/L (ref 22–32)
CREATININE: 0.85 mg/dL (ref 0.61–1.24)
Calcium: 9.1 mg/dL (ref 8.9–10.3)
Glucose, Bld: 99 mg/dL (ref 65–99)
POTASSIUM: 4.3 mmol/L (ref 3.5–5.1)
SODIUM: 139 mmol/L (ref 135–145)
Total Bilirubin: 0.4 mg/dL (ref 0.3–1.2)
Total Protein: 7.3 g/dL (ref 6.5–8.1)

## 2015-02-23 LAB — OCCULT BLOOD X 1 CARD TO LAB, STOOL: Fecal Occult Bld: NEGATIVE

## 2015-02-23 LAB — URINALYSIS, ROUTINE W REFLEX MICROSCOPIC
Bilirubin Urine: NEGATIVE
Glucose, UA: NEGATIVE mg/dL
Hgb urine dipstick: NEGATIVE
KETONES UR: NEGATIVE mg/dL
LEUKOCYTES UA: NEGATIVE
NITRITE: NEGATIVE
PH: 5 (ref 5.0–8.0)
PROTEIN: NEGATIVE mg/dL
Specific Gravity, Urine: 1.034 — ABNORMAL HIGH (ref 1.005–1.030)
Urobilinogen, UA: 0.2 mg/dL (ref 0.0–1.0)

## 2015-02-23 LAB — LIPASE, BLOOD: LIPASE: 35 U/L (ref 22–51)

## 2015-02-23 MED ORDER — OMEPRAZOLE 20 MG PO CPDR: 20.0000 mg | DELAYED_RELEASE_CAPSULE | Freq: Two times a day (BID) | ORAL | Status: DC

## 2015-02-23 MED ORDER — ONDANSETRON HCL 4 MG PO TABS: 4.0000 mg | ORAL_TABLET | Freq: Three times a day (TID) | ORAL | Status: DC | PRN

## 2015-02-23 MED ORDER — HYDROCODONE-ACETAMINOPHEN 7.5-325 MG PO TABS
1.0000 | ORAL_TABLET | Freq: Once | ORAL | Status: AC
  Administered 2015-02-23: 1 via ORAL
  Filled 2015-02-23: qty 1

## 2015-02-23 NOTE — ED Provider Notes (Signed)
CSN: [id number]     Arrival date & time 02/23/15  0910 History   First MD Initiated Contact with Patient 02/23/15 [phone]     Chief Complaint  Patient presents with  . Abdominal Pain  . Nausea    HPI:  Patient is a 24 y.o. male presenting with abdominal pain and diarrhea.  Abdominal Pain Pain location:  Epigastric Pain quality: aching, sharp and squeezing   Timing:  Constant Context: alcohol use, recent illness and sick contacts   Context: not diet changes, not recent travel and not suspicious food intake   Associated symptoms: diarrhea and nausea   Associated symptoms: no chest pain, no fever, no shortness of breath and no vomiting   Diarrhea Quality:  Semi-solid and black and tarry Onset quality:  Sudden Duration:  2 days Timing:  Constant Progression:  Worsening Relieved by:  None tried Associated symptoms: abdominal pain   Associated symptoms: no fever and no vomiting   Risk factors: recent antibiotic use and sick contacts     Patient reports that about 2 days ago he started having abdominal pain with associated diarrhea. He states that his fiance has also been sick and vomiting for the same amount of time. Says he feels like he has "heartburn in my stomach." Denies seeing any blood in stool but describes dark stool. [place]. States that he only drinks bottled water. Patient has been able to stay hydrated during this course.  Just in ED on 01/20/15 for pharyngitis and was given a course of antibiotics which he finished a couple weeks ago.   Past Medical History  Diagnosis Date  . Asthma     as child   Past Surgical History  Procedure Laterality Date  . No past surgeries    . Orif ankle fracture Right 09/26/2013    Procedure: OPEN REDUCTION INTERNAL FIXATION (ORIF) RIGHT  ANKLE FRACTURE;  Surgeon: Budd Palmer, MD;  Location: [facility];  Service: Orthopedics;  Laterality: Right;   No family history on file. Social History   Substance Use Topics  . Smoking status: Current Every Day Smoker -- 0.50 packs/day for 4 years    Types: Cigarettes  . Smokeless tobacco: None  . Alcohol Use: Yes     Comment: socially    Review of Systems  Constitutional: Negative for fever.  Respiratory: Negative for shortness of breath.   Cardiovascular: Negative for chest pain.  Gastrointestinal: Positive for nausea, abdominal pain and diarrhea. Negative for vomiting and blood in stool.  Musculoskeletal: Negative for back pain.  Also per HPI  Allergies  Review of patient's allergies indicates no known allergies.  Home Medications   Prior to Admission medications   Not on File   BP 144/75 mmHg  Pulse 56  Temp(Src) 98.3 F (36.8 C) (Oral)  Resp 20  Ht 5\' 9"  (1.753 m)  Wt 213 lb (96.616 kg)  BMI 31.44 kg/m2  SpO2 100% Physical Exam  Constitutional: He is oriented to person, place, and time. He appears well-developed and well-nourished. No distress.  Cardiovascular: Normal rate, regular rhythm, normal heart sounds and intact distal pulses.   Pulmonary/Chest: Effort normal and breath sounds normal.  Abdominal: Soft. Normal appearance. Bowel sounds are decreased. There is no hepatosplenomegaly. There is tenderness in the right upper quadrant, epigastric area and left upper quadrant. There is guarding.  Musculoskeletal: Normal range of motion.  Neurological: He is alert and oriented to person, place, and time.  Skin: Skin is warm and dry.  Psychiatric: He has a normal mood and affect.    ED Course  Procedures (including critical care time) Labs Review Results for orders placed or performed during the hospital encounter of 02/23/15  Lipase, blood  Result Value Ref Range   Lipase 35 22 - 51 U/L  Comprehensive metabolic panel  Result Value Ref Range   Sodium 139 135 - 145 mmol/L   Potassium 4.3 3.5 - 5.1 mmol/L   Chloride 106 101 - 111 mmol/L   CO2 28 22 - 32 mmol/L   Glucose, Bld 99 65 - 99 mg/dL   BUN 9 6 - 20  mg/dL   Creatinine, Ser 1.61 0.61 - 1.24 mg/dL   Calcium 9.1 8.9 - 09.6 mg/dL   Total Protein 7.3 6.5 - 8.1 g/dL   Albumin 3.9 3.5 - 5.0 g/dL   AST 34 15 - 41 U/L   ALT 32 17 - 63 U/L   Alkaline Phosphatase 80 38 - 126 U/L   Total Bilirubin 0.4 0.3 - 1.2 mg/dL   GFR calc non Af Amer >60 >60 mL/min   GFR calc Af Amer >60 >60 mL/min   Anion gap 5 5 - 15  CBC  Result Value Ref Range   WBC 8.8 4.0 - 10.5 K/uL   RBC 5.17 4.22 - 5.81 MIL/uL   Hemoglobin 14.5 13.0 - 17.0 g/dL   HCT 04.5 40.9 - 81.1 %   MCV 85.5 78.0 - 100.0 fL   MCH 28.0 26.0 - 34.0 pg   MCHC 32.8 30.0 - 36.0 g/dL   RDW 91.4 78.2 - 95.6 %   Platelets 289 150 - 400 K/uL  Urinalysis, Routine w reflex microscopic (not at [facility])  Result Value Ref Range   Color, Urine YELLOW YELLOW   APPearance CLEAR CLEAR   Specific Gravity, Urine 1.034 (H) 1.005 - 1.030   pH 5.0 5.0 - 8.0   Glucose, UA NEGATIVE NEGATIVE mg/dL   Hgb urine dipstick NEGATIVE NEGATIVE   Bilirubin Urine NEGATIVE NEGATIVE   Ketones, ur NEGATIVE NEGATIVE mg/dL   Protein, ur NEGATIVE NEGATIVE mg/dL   Urobilinogen, UA 0.2 0.0 - 1.0 mg/dL   Nitrite NEGATIVE NEGATIVE   Leukocytes, UA NEGATIVE NEGATIVE  Occult blood card to lab, stool Provider will collect  Result Value Ref Range   Fecal Occult Bld NEGATIVE NEGATIVE   Imaging Review No results found. I have personally reviewed and evaluated these images and lab results as part of my medical decision-making.   EKG Interpretation None      MDM   Final diagnoses:  Pain of upper abdomen  Diarrhea  Viral gastroenteritis   Symptoms consistent with viral process. No concerning red flags. FOBT negative. Patient given reassurance and return precautions given.    Caryl Ada, DO PGY-2, [facility]    Pincus Large, DO 02/23/15 1215  Pincus Large, DO 02/23/15 1222  Azalia Bilis, MD 02/23/15 1224

## 2015-02-23 NOTE — Discharge Instructions (Signed)
Viral Gastroenteritis Viral gastroenteritis is also called stomach flu. This illness is caused by a certain type of germ (virus). It can cause sudden watery poop (diarrhea) and throwing up (vomiting). This can cause you to lose body fluids (dehydration). This illness usually lasts for 3 to 8 days. It usually goes away on its own. HOME CARE   Drink enough fluids to keep your pee (urine) clear or pale yellow. Drink small amounts of fluids often.  Ask your doctor how to replace body fluid losses (rehydration).  Avoid:  Foods high in sugar.  Alcohol.  Bubbly (carbonated) drinks.  Tobacco.  Juice.  Caffeine drinks.  Very hot or cold fluids.  Fatty, greasy foods.  Eating too much at one time.  Dairy products until 24 to 48 hours after your watery poop stops.  You may eat foods with active cultures (probiotics). They can be found in some yogurts and supplements.  Wash your hands well to avoid spreading the illness.  Only take medicines as told by your doctor. Do not give aspirin to children. Do not take medicines for watery poop (antidiarrheals).  Ask your doctor if you should keep taking your regular medicines.  Keep all doctor visits as told. GET HELP RIGHT AWAY IF:   You cannot keep fluids down.  You do not pee at least once every 6 to 8 hours.  You are short of breath.  You see blood in your poop or throw up. This may look like coffee grounds.  You have belly (abdominal) pain that gets worse or is just in one small spot (localized).  You keep throwing up or having watery poop.  You have a fever.  The patient is a child younger than 3 months, and he or she has a fever.  The patient is a child older than 3 months, and he or she has a fever and problems that do not go away.  The patient is a child older than 3 months, and he or she has a fever and problems that suddenly get worse.  The patient is a baby, and he or she has no tears when crying. MAKE SURE YOU:     Understand these instructions.  Will watch your condition.  Will get help right away if you are not doing well or get worse. Document Released: 11/22/2007 Document Revised: 08/28/2011 Document Reviewed: 03/22/2011 ExitCare Patient Information 2015 [employer]. This information is not intended to replace advice given to you by your health care provider. Make sure you discuss any questions you have with your health care provider.  

## 2015-02-23 NOTE — ED Notes (Signed)
Patient states abdominal pain x 2 days with nausea.  Patient states no vomiting.  Patient states had some loose stools.

## 2015-08-31 ENCOUNTER — Encounter (HOSPITAL_COMMUNITY): Payer: Self-pay

## 2015-08-31 ENCOUNTER — Emergency Department (HOSPITAL_COMMUNITY)
Admission: EM | Admit: 2015-08-31 | Discharge: 2015-08-31 | Disposition: A | Discharge status: Home / Self Care | Payer: BLUE CROSS/BLUE SHIELD | Attending: Emergency Medicine | Admitting: Emergency Medicine

## 2015-08-31 DIAGNOSIS — J45909 Unspecified asthma, uncomplicated: Secondary | ICD-10-CM | POA: Diagnosis not present

## 2015-08-31 DIAGNOSIS — F17210 Nicotine dependence, cigarettes, uncomplicated: Secondary | ICD-10-CM | POA: Insufficient documentation

## 2015-08-31 DIAGNOSIS — J029 Acute pharyngitis, unspecified: Secondary | ICD-10-CM

## 2015-08-31 DIAGNOSIS — J069 Acute upper respiratory infection, unspecified: Secondary | ICD-10-CM | POA: Insufficient documentation

## 2015-08-31 DIAGNOSIS — Z79899 Other long term (current) drug therapy: Secondary | ICD-10-CM | POA: Diagnosis not present

## 2015-08-31 DIAGNOSIS — R05 Cough: Secondary | ICD-10-CM | POA: Diagnosis present

## 2015-08-31 LAB — RAPID STREP SCREEN (MED CTR MEBANE ONLY): Streptococcus, Group A Screen (Direct): NEGATIVE

## 2015-08-31 MED ORDER — IBUPROFEN 800 MG PO TABS: 800.0000 mg | ORAL_TABLET | Freq: Three times a day (TID) | ORAL | Status: DC

## 2015-08-31 MED ORDER — IBUPROFEN 400 MG PO TABS
800.0000 mg | ORAL_TABLET | Freq: Once | ORAL | Status: AC
  Administered 2015-08-31: 800 mg via ORAL
  Filled 2015-08-31: qty 2

## 2015-08-31 MED ORDER — CETIRIZINE HCL 10 MG PO TABS: 10.0000 mg | ORAL_TABLET | Freq: Every day | ORAL | Status: DC

## 2015-08-31 NOTE — ED Provider Notes (Signed)
CSN: [id number]     Arrival date & time 08/31/15  1143 History  By signing my name below, I, Emmanuella Mensah, attest that this documentation has been prepared under the direction and in the presence of Bebe Moncure, [place]. Electronically Signed: Angelene GiovanniEmmanuella Mensah, ED Scribe. 08/31/2015. 1:32 PM.    Chief Complaint  Patient presents with  . Cough  . Nasal Congestion   The history is provided by the patient. No language interpreter was used.   HPI Comments: Alvin Webb is a 25 y.o. male who presents to the Emergency Department complaining of gradually worsening constant moderate sore throat onset yesterday. He reports associated rhinorrhea, hoarse voice, and trouble breathing at night due to the rhinorrhea. No alleviating symptoms noted. Pt has tried Dayquil and throat lozenges with no relief. He denies any ear pain, wheezing, SOB, or trouble swallowing. Denies cough.   Past Medical History  Diagnosis Date  . Asthma     as child   Past Surgical History  Procedure Laterality Date  . No past surgeries    . Orif ankle fracture Right 09/26/2013    Procedure: OPEN REDUCTION INTERNAL FIXATION (ORIF) RIGHT  ANKLE FRACTURE;  Surgeon: Budd PalmerMichael H Handy, MD;  Location: [facility];  Service: Orthopedics;  Laterality: Right;   No family history on file. Social History  Substance Use Topics  . Smoking status: Current Every Day Smoker -- 0.50 packs/day for 4 years    Types: Cigarettes  . Smokeless tobacco: None  . Alcohol Use: Yes     Comment: socially    Review of Systems  HENT: Positive for rhinorrhea, sore throat and voice change. Negative for ear pain and trouble swallowing.   Respiratory: Negative for shortness of breath and wheezing.   All other systems reviewed and are negative.     Allergies  Review of patient's allergies indicates no known allergies.  Home Medications   Prior to Admission medications   Medication Sig Start Date End Date Taking? Authorizing Provider  omeprazole  (PRILOSEC) 20 MG capsule Take 1 capsule (20 mg total) by mouth 2 (two) times daily. 02/23/15   Pincus LargeJazma Y Phelps, DO  ondansetron (ZOFRAN) 4 MG tablet Take 1 tablet (4 mg total) by mouth every 8 (eight) hours as needed for nausea or vomiting. 02/23/15   Pincus LargeJazma Y Phelps, DO   BP 159/89 mmHg  Pulse 69  Temp(Src) 98.5 F (36.9 C) (Oral)  Resp 18  Ht 5\' 9"  (1.753 m)  Wt 203 lb 8 oz (92.307 kg)  BMI 30.04 kg/m2  SpO2 98% Physical Exam  Constitutional: He is oriented to person, place, and time. He appears well-developed and well-nourished.  HENT:  Head: Normocephalic and atraumatic.  Nose: Mucosal edema present.  Mouth/Throat: Posterior oropharyngeal edema present. No oropharyngeal exudate or posterior oropharyngeal erythema.  Oropharynx erythema, no edema Tonsils mildly enlarged but no exudate   Eyes: Conjunctivae and EOM are normal.  Cardiovascular: Normal rate.   Pulmonary/Chest: Effort normal and breath sounds normal. No respiratory distress. He has no wheezes. He has no rales.  Lungs are clear  Abdominal: He exhibits no distension.  Neurological: He is alert and oriented to person, place, and time.  Skin: Skin is warm and dry.  Psychiatric: He has a normal mood and affect.  Nursing note and vitals reviewed.   ED Course  Procedures (including critical care time) DIAGNOSTIC STUDIES: Oxygen Saturation is 98% on RA, normal by my interpretation.    COORDINATION OF CARE: 1:18 PM- Pt advised of plan  for treatment and pt agrees. Pt will receive rapid strep test for further evaluation.    Labs Review Labs Reviewed  RAPID STREP SCREEN (NOT AT [facility])  CULTURE, GROUP A STREP [facility])    Noelle Penner, PA-C has personally reviewed and evaluated these lab results as part of her medical decision-making.  MDM   Final diagnoses:  Pharyngitis  URI (upper respiratory infection)    Rapid strep negative. Likely viral pharyngitis/URI. Rx given for ibuprofen and zyrtec. Resource guide given to  establish PCP for f/u. ER return precautions given.  I personally performed the services described in this documentation, which was scribed in my presence. The recorded information has been reviewed and is accurate.   Carlene Coria, PA-C 08/31/15 1409  Doug Sou, MD 09/01/15 [phone]

## 2015-08-31 NOTE — ED Notes (Signed)
Patient here with congestion and sore throat x 1 day. 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

## 2015-08-31 NOTE — Discharge Instructions (Signed)
Your strep test was negative. You likely have a virus. I will give you a couple prescriptions to help with your pain and drainage.  Take medications as prescribed. Return to the emergency room for worsening condition or new concerning symptoms. Follow up with your regular doctor. If you don't have a regular doctor use one of the numbers below to establish a primary care doctor.   Emergency Department Resource Guide 1) Find a Doctor and Pay Out of Pocket Although you won't have to find out who is covered by your insurance plan, it is a good idea to ask around and get recommendations. You will then need to call the office and see if the doctor you have chosen will accept you as a new patient and what types of options they offer for patients who are self-pay. Some doctors offer discounts or will set up payment plans for their patients who do not have insurance, but you will need to ask so you aren't surprised when you get to your appointment.  2) Contact Your Local Health Department Not all health departments have doctors that can see patients for sick visits, but many do, so it is worth a call to see if yours does. If you don't know where your local health department is, you can check in your phone book. The CDC also has a tool to help you locate your state's health department, and many state websites also have listings of all of their local health departments.  3) Find a Walk-in Clinic If your illness is not likely to be very severe or complicated, you may want to try a walk in clinic. These are popping up all over the country in pharmacies, drugstores, and shopping centers. They're usually staffed by nurse practitioners or physician assistants that have been trained to treat common illnesses and complaints. They're usually fairly quick and inexpensive. However, if you have serious medical issues or chronic medical problems, these are probably not your best option.  No Primary Care Doctor: - Call  Health Connect at  [phone] - they can help you locate a primary care doctor that  accepts your insurance, provides certain services, etc. - Physician Referral [phone]- [phone]  Emergency Department Resource Guide 1) Find a Doctor and Pay Out of Pocket Although you won't have to find out who is covered by your insurance plan, it is a good idea to ask around and get recommendations. You will then need to call the office and see if the doctor you have chosen will accept you as a new patient and what types of options they offer for patients who are self-pay. Some doctors offer discounts or will set up payment plans for their patients who do not have insurance, but you will need to ask so you aren't surprised when you get to your appointment.  2) Contact Your Local Health Department Not all health departments have doctors that can see patients for sick visits, but many do, so it is worth a call to see if yours does. If you don't know where your local health department is, you can check in your phone book. The CDC also has a tool to help you locate your state's health department, and many state websites also have listings of all of their local health departments.  3) Find a Walk-in Clinic If your illness is not likely to be very severe or complicated, you may want to try a walk in clinic. These are popping up all over the country in pharmacies, drugstores,  and shopping centers. They're usually staffed by nurse practitioners or physician assistants that have been trained to treat common illnesses and complaints. They're usually fairly quick and inexpensive. However, if you have serious medical issues or chronic medical problems, these are probably not your best option.  No Primary Care Doctor: - Call Health Connect at  [phone] - they can help you locate a primary care doctor that  accepts your insurance, provides certain services, etc. - Physician Referral Service- [phone]  Chronic Pain  Problems: Organization         Address  Phone   Notes  [facility]  [phone] Patients need to be referred by their primary care doctor.   Medication Assistance: Organization         Address  Phone   Notes  [facility] Medication [facility] [address] [place], [place] [address] [phone] --Must be a resident of [facility] -- Must have NO insurance coverage whatsoever (no Medicaid/ Medicare, etc.) -- The pt. MUST have a primary care doctor that directs their care regularly and follows them in the community   [facility]  [phone]   [employer]  [phone]    Agencies that provide inexpensive medical care: Organization         Address  Phone   Notes  [facility]  [phone]   [facility]    [phone]   [facility] [address] [place], [place] [address] [phone]   [facility] [address] [place], [place] [phone]   [facility]    [phone]   [facility]    [phone]   [facility]  [address], [place] Phone:  [phone], Fax:  [phone] Hours of Operation:  9 am - 6 pm, M-F.  Also accepts Medicaid/Medicare and self-pay.  [facility]  [address], [place] Phone: [phone], Fax: [phone]. Hours of Operation:  8:30 am - 5:30 pm, M-F.  Also accepts Medicaid and self-pay.  [facility] [place] [address], [place] Phone: [phone]   [facility] [address] Natasha Bence [place], [place] [phone], Ext. [phone] Mondays & Thursdays: 7-9 AM.  First 15 patients are seen on a first come, first serve basis.    Medicaid-accepting [place] Providers:  Organization         Address  Phone   Notes  [facility] [address], [place] [phone] Also  accepts self-pay patients.  [facility] [address] Laurell Josephs [place], [place]  [phone]   [facility] [address], [place] [phone]   [facility] [address], [place] [phone]   Renaye Rakers [address], [place]   [phone] Only accepts [place] patients after they have their name applied to their card.   Self-Pay (no insurance) in [facility]:  Organization         Address  Phone   Notes  Sickle Cell Patients, [facility] [address] [place], [place] [phone]   [facility] [address] [place], [place] [phone]   [facility] [place]  [address], [place] [phone]  [phone]   [facility]/Dr. Julio Sicks  [address], [place] or [address], [place] [phone] Phone number for both [place] and [place] locations is the same.  [facility] and [facility] [address] [place] [phone]   [facility] [address] [place] or [address] [phone]   [facility] [address] [place] [phone], phone; [phone], fax Sees patients 1st and 3rd Saturday of every month.  Must not qualify for public or private insurance (i.e. Medicaid, Medicare, [place] Health Choice, Veterans' Benefits)  Household income should be no more than 200% of the poverty level The clinic cannot treat you if you are pregnant or think you are pregnant  Sexually transmitted diseases are not treated at the clinic.

## 2015-09-02 LAB — CULTURE, GROUP A STREP (THRC)

## 2016-09-12 ENCOUNTER — Ambulatory Visit (HOSPITAL_COMMUNITY)
Admission: EM | Admit: 2016-09-12 | Discharge: 2016-09-12 | Disposition: A | Discharge status: Home / Self Care | Payer: BLUE CROSS/BLUE SHIELD | Attending: Family Medicine | Admitting: Family Medicine

## 2016-09-12 ENCOUNTER — Encounter (HOSPITAL_COMMUNITY): Payer: Self-pay | Admitting: Emergency Medicine

## 2016-09-12 DIAGNOSIS — A084 Viral intestinal infection, unspecified: Secondary | ICD-10-CM | POA: Diagnosis not present

## 2016-09-12 DIAGNOSIS — R1112 Projectile vomiting: Secondary | ICD-10-CM | POA: Diagnosis not present

## 2016-09-12 DIAGNOSIS — R112 Nausea with vomiting, unspecified: Secondary | ICD-10-CM

## 2016-09-12 DIAGNOSIS — R110 Nausea: Secondary | ICD-10-CM | POA: Diagnosis not present

## 2016-09-12 MED ORDER — DICYCLOMINE HCL 20 MG PO TABS: 20.0000 mg | ORAL_TABLET | Freq: Two times a day (BID) | ORAL | 0 refills | Status: DC | PRN

## 2016-09-12 MED ORDER — ONDANSETRON 4 MG PO TBDP
4.0000 mg | ORAL_TABLET | Freq: Once | ORAL | Status: AC
  Administered 2016-09-12: 4 mg via ORAL

## 2016-09-12 MED ORDER — ONDANSETRON 4 MG PO TBDP
ORAL_TABLET | ORAL | Status: AC
  Filled 2016-09-12: qty 1

## 2016-09-12 MED ORDER — ONDANSETRON 4 MG PO TBDP
4.0000 mg | ORAL_TABLET | Freq: Three times a day (TID) | ORAL | 0 refills | Status: DC | PRN
Start: 2016-09-12 — End: 2020-09-28

## 2016-09-12 NOTE — Discharge Instructions (Signed)
Likely a viral illness.Encourage to increase fluids. Lactose free diet. Take medications as prescribed. If Sx escalates advise to go to ED

## 2016-09-12 NOTE — ED Provider Notes (Signed)
CSN: [id number]     Arrival date & time 09/12/16  1145 History   First MD Initiated Contact with Patient 09/12/16 1258     Chief Complaint  Patient presents with  . Nausea   (Consider location/radiation/quality/duration/timing/severity/associated sxs/prior Treatment) The history is provided by the patient.  26 y/o male presented with CC of cramping abdominal pain since today morning with 1 episode of vomit and 2 BM, formed. Denies blood in vomitus or stool. Denies fever/chills. 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 A&Ox3. No signs of acute distress  Past Medical History:  Diagnosis Date  . Asthma    as child   Past Surgical History:  Procedure Laterality Date  . NO PAST SURGERIES    . ORIF ANKLE FRACTURE Right 09/26/2013   Procedure: OPEN REDUCTION INTERNAL FIXATION (ORIF) RIGHT  ANKLE FRACTURE;  Surgeon: Budd Palmer, MD;  Location: [facility] OR;  Service: Orthopedics;  Laterality: Right;   No family history on file. Social History  Substance Use Topics  . Smoking status: Current Every Day Smoker    Packs/day: 0.25    Years: 4.00    Types: Cigarettes  . Smokeless tobacco: Never Used  . Alcohol use Yes     Comment: 1 per day    Review of Systems  Constitutional: Positive for chills. Negative for activity change, appetite change and fatigue.  HENT: Negative.   Respiratory: Negative.   Cardiovascular: Negative.   Gastrointestinal: Positive for abdominal pain, nausea and vomiting. Negative for blood in stool and diarrhea.  Genitourinary: Negative.     Allergies  Patient has no known allergies.  Home Medications   Prior to Admission medications   Medication Sig Start Date End Date Taking? Authorizing Provider  cetirizine (ZYRTEC) 10 MG tablet Take 1 tablet (10 mg total) by mouth daily. 08/31/15   Ace Gins Sam, PA-C  ibuprofen (ADVIL,MOTRIN) 800 MG tablet Take 1 tablet (800 mg total) by mouth 3 (three) times daily.  08/31/15   Ace Gins Sam, PA-C  omeprazole (PRILOSEC) 20 MG capsule Take 1 capsule (20 mg total) by mouth 2 (two) times daily. 02/23/15   Pincus Large, DO  ondansetron (ZOFRAN) 4 MG tablet Take 1 tablet (4 mg total) by mouth every 8 (eight) hours as needed for nausea or vomiting. 02/23/15   Pincus Large, DO   Meds Ordered and Administered this Visit  Medications - No data to display  BP (!) 149/84   Pulse (!) 56   Temp 98.4 F (36.9 C) (Oral)   Resp 16   Ht 5\' 8"  (1.727 m)   Wt 215 lb (97.5 kg)   SpO2 99%   BMI 32.69 kg/m  No data found.   Physical Exam  Constitutional: He is oriented to person, place, and time. He appears well-developed and well-nourished. No distress.  HENT:  Head: Normocephalic.  Eyes: Pupils are equal, round, and reactive to light.  Neck: Neck supple.  Cardiovascular: Normal rate, regular rhythm and normal heart sounds.   Pulmonary/Chest: Effort normal and breath sounds normal.  Abdominal: Soft. Bowel sounds are normal. He exhibits no distension and no mass. There is tenderness (Tenderness to epigastric area to deep palpation). There is no rebound and no guarding. No hernia.  Neurological: He is alert and oriented to person, place, and time.  Skin: Skin is warm. Capillary refill takes less than 2 seconds.  Psychiatric: He has a normal mood and affect.  Urgent Care Course     Procedures (including critical care time)  Labs Review Labs Reviewed - No data to display  Imaging Review No results found.   Visual Acuity Review  Right Eye Distance:   Left Eye Distance:   Bilateral Distance:    Right Eye Near:   Left Eye Near:    Bilateral Near:         MDM   1. Nausea   2. Viral gastroenteritis   3. Nausea and vomiting, intractability of vomiting not specified, unspecified vomiting type   Likely a viral illness.Encourage to increase fluids. Lactose free diet. Take medications as prescribed. If Sx escalates advise to go to ED     Calah Gershman, NP 09/12/16 1318    Rhyleigh Grassel, NP 09/12/16 1322

## 2016-09-12 NOTE — ED Triage Notes (Signed)
PT reports he woke up this morning very sweaty. PT was also nauseated. PT strained to have a BM and started to dry heave. PT had 2 formed BMs. PT vomited once this AM. PT reports vomit contained food particles and mucus. PT works in a nursing home.

## 2019-06-16 ENCOUNTER — Ambulatory Visit: Payer: BC Managed Care – PPO | Attending: Internal Medicine

## 2019-06-16 DIAGNOSIS — Z20822 Contact with and (suspected) exposure to covid-19: Secondary | ICD-10-CM

## 2019-06-18 LAB — NOVEL CORONAVIRUS, NAA: SARS-CoV-2, NAA: NOT DETECTED

## 2020-02-06 ENCOUNTER — Emergency Department (HOSPITAL_COMMUNITY)
Admission: EM | Admit: 2020-02-06 | Discharge: 2020-02-07 | Disposition: A | Discharge status: Home / Self Care | Payer: BC Managed Care – PPO | Attending: Emergency Medicine | Admitting: Emergency Medicine

## 2020-02-06 ENCOUNTER — Encounter (HOSPITAL_COMMUNITY): Payer: Self-pay | Admitting: Pediatrics

## 2020-02-06 ENCOUNTER — Other Ambulatory Visit: Payer: Self-pay

## 2020-02-06 ENCOUNTER — Emergency Department (HOSPITAL_COMMUNITY): Payer: BC Managed Care – PPO

## 2020-02-06 DIAGNOSIS — J45909 Unspecified asthma, uncomplicated: Secondary | ICD-10-CM | POA: Insufficient documentation

## 2020-02-06 DIAGNOSIS — M792 Neuralgia and neuritis, unspecified: Secondary | ICD-10-CM

## 2020-02-06 DIAGNOSIS — Z79899 Other long term (current) drug therapy: Secondary | ICD-10-CM | POA: Diagnosis not present

## 2020-02-06 DIAGNOSIS — F17210 Nicotine dependence, cigarettes, uncomplicated: Secondary | ICD-10-CM | POA: Insufficient documentation

## 2020-02-06 DIAGNOSIS — R0789 Other chest pain: Secondary | ICD-10-CM | POA: Insufficient documentation

## 2020-02-06 DIAGNOSIS — M79602 Pain in left arm: Secondary | ICD-10-CM | POA: Diagnosis not present

## 2020-02-06 LAB — CBC
HCT: 46.1 % (ref 39.0–52.0)
Hemoglobin: 14.6 g/dL (ref 13.0–17.0)
MCH: 27.4 pg (ref 26.0–34.0)
MCHC: 31.7 g/dL (ref 30.0–36.0)
MCV: 86.5 fL (ref 80.0–100.0)
Platelets: 291 10*3/uL (ref 150–400)
RBC: 5.33 MIL/uL (ref 4.22–5.81)
RDW: 13.5 % (ref 11.5–15.5)
WBC: 12.4 10*3/uL — ABNORMAL HIGH (ref 4.0–10.5)
nRBC: 0 % (ref 0.0–0.2)

## 2020-02-06 LAB — BASIC METABOLIC PANEL
Anion gap: 9 (ref 5–15)
BUN: 7 mg/dL (ref 6–20)
CO2: 25 mmol/L (ref 22–32)
Calcium: 9.5 mg/dL (ref 8.9–10.3)
Chloride: 105 mmol/L (ref 98–111)
Creatinine, Ser: 0.85 mg/dL (ref 0.61–1.24)
GFR calc Af Amer: >60 mL/min (ref >60)
GFR calc non Af Amer: >60 mL/min (ref >60)
Glucose, Bld: 80 mg/dL (ref 70–99)
Potassium: 3.7 mmol/L (ref 3.5–5.1)
Sodium: 139 mmol/L (ref 135–145)

## 2020-02-06 LAB — TROPONIN I (HIGH SENSITIVITY)
Troponin I (High Sensitivity): 5 ng/L (ref <18)
Troponin I (High Sensitivity): 4 ng/L (ref <18)

## 2020-02-06 NOTE — ED Notes (Signed)
Pt advised he would be waiting outside for food and would be back

## 2020-02-06 NOTE — ED Triage Notes (Signed)
C/O left sided arm and neck pain x 1 week. Denies PMH.

## 2020-02-06 NOTE — ED Notes (Signed)
PT IS BACK IN THE WAITING ROOM

## 2020-02-07 MED ORDER — OXYCODONE-ACETAMINOPHEN 5-325 MG PO TABS
1.0000 | ORAL_TABLET | Freq: Once | ORAL | Status: AC
  Administered 2020-02-07: 1 via ORAL
  Filled 2020-02-07: qty 1

## 2020-02-07 MED ORDER — NAPROXEN 500 MG PO TABS: 500.0000 mg | ORAL_TABLET | Freq: Three times a day (TID) | ORAL | 0 refills | Status: AC

## 2020-02-07 MED ORDER — PREDNISONE 20 MG PO TABS
60.0000 mg | ORAL_TABLET | Freq: Once | ORAL | Status: AC
Start: 2020-02-07 — End: 2020-02-07
  Administered 2020-02-07: 60 mg via ORAL
  Filled 2020-02-07: qty 3

## 2020-02-07 MED ORDER — NAPROXEN 250 MG PO TABS
500.0000 mg | ORAL_TABLET | Freq: Once | ORAL | Status: AC
  Administered 2020-02-07: 500 mg via ORAL
  Filled 2020-02-07: qty 2

## 2020-02-07 MED ORDER — METHYLPREDNISOLONE 4 MG PO TBPK: ORAL_TABLET | ORAL | 0 refills | Status: DC

## 2020-02-07 MED ORDER — METHOCARBAMOL 500 MG PO TABS
1000.0000 mg | ORAL_TABLET | Freq: Once | ORAL | Status: AC
  Administered 2020-02-07: 1000 mg via ORAL
  Filled 2020-02-07: qty 2

## 2020-02-07 MED ORDER — METHOCARBAMOL 500 MG PO TABS: 1000.0000 mg | ORAL_TABLET | Freq: Three times a day (TID) | ORAL | 0 refills | Status: AC

## 2020-02-07 NOTE — Discharge Instructions (Addendum)
Given your symptoms and physical exam findings, I suspect you have either muscular spasm due to overuse/recent activity or nerve inflammation ("radiculopathy")  We will treat your inflammation with the following medication regimen: Steroid taper (medrol dosepak) - take this as instructed and until completed Robaxin 1000 mg every 8 hours x 5 days Naproxen 500 mg every 8 hours with meals x 5 days Can add acetaminophen (tylenol) [phone] mg every 8 hours x 5 days for more pain control Heating pad as needed Over the counter lidocaine patches (salonpas) can be helpful  Avoid any exacerbating activities for the next 48 hours.  After 48 hours, start doing light back range of motion exercises, massage as tolerated  Follow up with neurosurgery in 7-10 days if symptoms not improving or return after medications, they may recommend MRI or other testing   Return for sudden onset paralysis or weakness, complete loss off sensation of extremity or face, fever, severe headaches

## 2020-02-07 NOTE — ED Provider Notes (Signed)
[facility] EMERGENCY DEPARTMENT Provider Note   CSN: [id number] Arrival date & time: 02/06/20  1607     History Chief Complaint  Patient presents with  . Arm Pain    Alvin Webb is a 29 y.o. male with asthma as a child, tobacco use presents to the ED for evaluation of chest pain and left arm pain.  Onset 1.5 weeks ago.  Reports sharp intermittent pain in the left upper part of his chest that radiates to his back, left neck, left shoulder, left arm and hand and thumb.  This is significantly worse with any movement of the left arm, bending of the neck specifically to the left side, bending forward, lifting or picking up heavy things.  The pain seems to start in the left upper chest and radiates outward to the left side of his arm and neck with any movement and lifting.  It lingers for about 5 minutes and eventually subsides.  Patient has been in the ED waiting room for 17 hours and overnight, states that he noticed every time his neck would drop forward as he was falling asleep in the chair he noticed the sharp pain start in the chest and radiate to his left arm.  Has associated decrease sensation in the left radial forearm and thumb that has worsened through the night while waiting in the ED.  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  Has been attributing this pain to may be just sleeping wrong because he did not want to come to the ED.  No interventions.  No alleviating factors.  Had some sinus congestion with sinus headache about 1 month ago that spontaneously resolved on its own.  Is not vaccinated for COVID-19.  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  He is right-hand dominant.  No previous neck injury.  He denies associated fever, cough, shortness of breath.  No pleuritic pain in the chest.  No  nausea, vomiting, abdominal pain.  No worsening pain after eating.  Denies increase in belching, burping or acid reflux symptoms.  Denies significant alcohol use or NSAID use.  No history of ulcers, GI bleed.  No history of PE/DVT.  No recent surgery, prolonged immobilization, leg swelling or calf pain, hemoptysis, hormone therapy or history of cancer.  States he was told his great uncle died at the age of 12 of a heart attack but does not know the details.  No history of early onset CAD of immediate family.   HPI     Past Medical History:  Diagnosis Date  . Asthma    as child    Patient Active Problem List   Diagnosis Date Noted  . Ankle fracture, right 09/26/2013    Past Surgical History:  Procedure Laterality Date  . NO PAST SURGERIES    . ORIF ANKLE FRACTURE Right 09/26/2013   Procedure: OPEN REDUCTION INTERNAL FIXATION (ORIF) RIGHT  ANKLE FRACTURE;  Surgeon: Budd Palmer, MD;  Location: [facility] OR;  Service: Orthopedics;  Laterality: Right;       No family history on file.  Social History   Tobacco Use  . Smoking status: Current Every Day Smoker    Packs/day: 0.25    Years: 4.00    Pack years: 1.00    Types: Cigarettes  .  Smokeless tobacco: Never Used  Substance Use Topics  . Alcohol use: Yes    Comment: 1 per day  . Drug use: No    Types: Marijuana    Comment: stopped a 1 1/2 month ago.     Home Medications Prior to Admission medications   Medication Sig Start Date End Date Taking? Authorizing Provider  cetirizine (ZYRTEC) 10 MG tablet Take 1 tablet (10 mg total) by mouth daily. 08/31/15   Sam, Ace Gins, PA-C  dicyclomine (BENTYL) 20 MG tablet Take 1 tablet (20 mg total) by mouth 2 (two) times daily between meals as needed for spasms. 09/12/16   Multani, Bhupinder, NP  ibuprofen (ADVIL,MOTRIN) 800 MG tablet Take 1 tablet (800 mg total) by mouth 3 (three) times daily. 08/31/15   Sam, Ace Gins, PA-C  methocarbamol (ROBAXIN) 500 MG tablet Take 2 tablets (1,000 mg  total) by mouth 3 (three) times daily for 5 days. 02/07/20 02/12/20  Liberty Handy, PA-C  methylPREDNISolone (MEDROL DOSEPAK) 4 MG TBPK tablet Take methylprednisone taper as prescribed 02/07/20   Liberty Handy, PA-C  naproxen (NAPROSYN) 500 MG tablet Take 1 tablet (500 mg total) by mouth 3 (three) times daily with meals for 5 days. 02/07/20 02/12/20  Liberty Handy, PA-C  omeprazole (PRILOSEC) 20 MG capsule Take 1 capsule (20 mg total) by mouth 2 (two) times daily. 02/23/15   Pincus Large, DO  ondansetron (ZOFRAN ODT) 4 MG disintegrating tablet Take 1 tablet (4 mg total) by mouth every 8 (eight) hours as needed for nausea or vomiting. 09/12/16   Multani, Bhupinder, NP  ondansetron (ZOFRAN) 4 MG tablet Take 1 tablet (4 mg total) by mouth every 8 (eight) hours as needed for nausea or vomiting. 02/23/15   Pincus Large, DO    Allergies    Patient has no known allergies.  Review of Systems   Review of Systems  Cardiovascular: Positive for chest pain.  Musculoskeletal: Positive for arthralgias and myalgias.  Neurological: Positive for numbness.  All other systems reviewed and are negative.   Physical Exam Updated Vital Signs BP (!) 138/106 (BP Location: Right Arm)   Pulse 89   Temp 98 F (36.7 C) (Oral)   Resp 18   Ht 5\' 9"  (1.753 m)   Wt 97.5 kg   SpO2 100%   BMI 31.75 kg/m   Physical Exam Vitals and nursing note reviewed.  Constitutional:      General: He is not in acute distress.    Appearance: He is well-developed.     Comments: NAD. Pleasant   HENT:     Head: Normocephalic and atraumatic.     Right Ear: External ear normal.     Left Ear: External ear normal.     Nose: Nose normal.  Eyes:     General: No scleral icterus.    Conjunctiva/sclera: Conjunctivae normal.  Neck:     Comments: C-spine: Pain reproduced with neck bend/rotation bilaterally but worse towards the left side.  Diffuse paraspinal cervical muscle and trapezius tenderness, worse on the left side.   Pain reproduced with left shoulder flexion and abduction past head level.  No meningismus.  Trachea midline. Cardiovascular:     Rate and Rhythm: Normal rate and regular rhythm.     Heart sounds: Normal heart sounds.     Comments: No carotid bruits.  1+ radial pulses bilaterally.  No murmurs.  No reproducible chest wall tenderness.  No lower extremity edema or calf tenderness.  Pain reproduced when  patient sat up and forward for exam Pulmonary:     Effort: Pulmonary effort is normal.     Breath sounds: Normal breath sounds.  Abdominal:     Palpations: Abdomen is soft.     Tenderness: There is no abdominal tenderness.     Comments: No epigastric tenderness.  Musculoskeletal:        General: No deformity. Normal range of motion.     Cervical back: Normal range of motion and neck supple. Tenderness present.     Comments: No focal bony tenderness or edema or warmth or fluctuance of the left shoulder, elbow, wrist or hand.  Compartments are soft in the left arm.  Reproducible pain with left neck bend/rotation, left shoulder flexion and extension.  Skin:    General: Skin is warm and dry.     Capillary Refill: Capillary refill takes less than 2 seconds.  Neurological:     Mental Status: He is alert and oriented to person, place, and time.     Sensory: Sensory deficit present.     Comments: Subjective decreased sensation to pinch and rub in the left thumb, left distal radial forearm.  Decreased strength in the left thumb only with handgrip, strength is normal in all other fingers, wrist, elbow and shoulder against resistance.  Psychiatric:        Behavior: Behavior normal.        Thought Content: Thought content normal.        Judgment: Judgment normal.     ED Results / Procedures / Treatments   Labs (all labs ordered are listed, but only abnormal results are displayed) Labs Reviewed  CBC - Abnormal; Notable for the following components:      Result Value   WBC 12.4 (*)    All other  components within normal limits  BASIC METABOLIC PANEL  TROPONIN I (HIGH SENSITIVITY)  TROPONIN I (HIGH SENSITIVITY)    EKG None  Radiology DG Chest 2 View  Result Date: 02/06/2020 CLINICAL DATA:  Chest pain EXAM: CHEST - 2 VIEW COMPARISON:  None. FINDINGS: The heart size and mediastinal contours are within normal limits. Both lungs are clear. The visualized skeletal structures are unremarkable. IMPRESSION: No active cardiopulmonary disease. Electronically Signed   By: Jasmine PangKim  Fujinaga M.D.   On: 02/06/2020 17:00    Procedures Procedures (including critical care time)  Medications Ordered in ED Medications  oxyCODONE-acetaminophen (PERCOCET/ROXICET) 5-325 MG per tablet 1 tablet (1 tablet Oral Given 02/07/20 0941)  predniSONE (DELTASONE) tablet 60 mg (60 mg Oral Given 02/07/20 0942)  naproxen (NAPROSYN) tablet 500 mg (500 mg Oral Given 02/07/20 0942)  methocarbamol (ROBAXIN) tablet 1,000 mg (1,000 mg Oral Given 02/07/20 0941)    ED Course  I have reviewed the triage vital signs and the nursing notes.  Pertinent labs & imaging results that were available during my care of the patient were reviewed by me and considered in my medical decision making (see chart for details).    MDM Rules/Calculators/A&P                           29 yo M with history of tobacco use, childhood asthma presents for chest pain.   Clinically pain sounds atypical with several features suggestive of MSk etiology.  Has associated radiating pain in neck, left arm with decreased sensation and strength in distribution of radial nerve suggestive of peripheral neuropathy/radiculopathy.  Chest reproducible with movement, lifting, bending forward.    EMR,  nursing and triage notes reviewed to obtain more history and assist with MDM.  Seen in ER 2014 for radicular pain in left arm.   Ddx includes MSK soft tissue or chest wall pain, radiculopathy/neuropathy.    No red flags like pleuritic pain.  No associated fever,  cough, SOB. No leg swelling/calf pain to suggest DVT.  Wells score is low risk and PERC negative.  Very low concern for PE.  Cardiac risk factors include tobacco use only.  HEART score is < 3.  ACS/NSTEMI unlikely. He has no post prandrial pain, history of ulcers, ETOH abuse or abdominal pain.  Doubt GI process like perforated viscus, bleeding ulcer.  No loss of distal pulses.  Has decreased sensation and strength only in radial nerve distribution but not in entire extremity and dissection or other vascular emergency unlikely.    Patient in ER for 17 hours prior to my assessment.   ER work up initiated in triage including EKG, trop x 2, labs, CXR.  These were personally visualized and interpreted.   Prednisone, percocet, robaxin, naproxen given in ER for symptom control.   EKG without ischemic changes, signs of RV strain, pericarditis. Normal intervals.  Trop x 2 undetectable. CXR without widening mediastinum, air, PTX, opacities/infiltrates.    Given symptomatology, exam, non ischemic cardiac work up in ER and HEART score patient is appropriate for discharge.  Work up not suggestive of life threatening process.  Discussed with patient likely nerve/MSK process.  Will dc with symptomatic management, rest, activity modification. Neurosurgery follow up for re-eval maybe MRI.  Patient comfortable with plan of care. ER return precautions given.    Final Clinical Impression(s) / ED Diagnoses Final diagnoses:  Atypical chest pain  Radicular pain in left arm    Rx / DC Orders ED Discharge Orders         Ordered    methylPREDNISolone (MEDROL DOSEPAK) 4 MG TBPK tablet        02/07/20 0932    methocarbamol (ROBAXIN) 500 MG tablet  3 times daily        02/07/20 0932    naproxen (NAPROSYN) 500 MG tablet  3 times daily with meals        02/07/20 0932           Liberty Handy, PA-C 02/07/20 1006    Eber Hong, MD 02/07/20 1159

## 2020-02-07 NOTE — ED Notes (Signed)
Patient given discharge instructions. Questions were answered. Patient verbalized understanding of discharge instructions and care at home.  

## 2020-09-28 ENCOUNTER — Ambulatory Visit (INDEPENDENT_AMBULATORY_CARE_PROVIDER_SITE_OTHER): Payer: BC Managed Care – PPO

## 2020-09-28 ENCOUNTER — Other Ambulatory Visit: Payer: Self-pay

## 2020-09-28 ENCOUNTER — Ambulatory Visit (HOSPITAL_COMMUNITY)
Admission: EM | Admit: 2020-09-28 | Discharge: 2020-09-28 | Disposition: A | Discharge status: Home / Self Care | Payer: BC Managed Care – PPO | Attending: Internal Medicine | Admitting: Internal Medicine

## 2020-09-28 ENCOUNTER — Encounter (HOSPITAL_COMMUNITY): Payer: Self-pay | Admitting: *Deleted

## 2020-09-28 DIAGNOSIS — R079 Chest pain, unspecified: Secondary | ICD-10-CM | POA: Diagnosis not present

## 2020-09-28 DIAGNOSIS — J4520 Mild intermittent asthma, uncomplicated: Secondary | ICD-10-CM

## 2020-09-28 DIAGNOSIS — R0789 Other chest pain: Secondary | ICD-10-CM | POA: Diagnosis not present

## 2020-09-28 DIAGNOSIS — J3089 Other allergic rhinitis: Secondary | ICD-10-CM | POA: Diagnosis not present

## 2020-09-28 DIAGNOSIS — F17200 Nicotine dependence, unspecified, uncomplicated: Secondary | ICD-10-CM | POA: Diagnosis not present

## 2020-09-28 MED ORDER — NAPROXEN 375 MG PO TABS: 375.0000 mg | ORAL_TABLET | Freq: Two times a day (BID) | ORAL | 0 refills | Status: AC

## 2020-09-28 MED ORDER — FLUTICASONE PROPIONATE 50 MCG/ACT NA SUSP: 2.0000 | Freq: Every day | NASAL | 0 refills | Status: AC

## 2020-09-28 MED ORDER — ALBUTEROL SULFATE HFA 108 (90 BASE) MCG/ACT IN AERS: 1.0000 | INHALATION_SPRAY | Freq: Four times a day (QID) | RESPIRATORY_TRACT | 0 refills | Status: AC | PRN

## 2020-09-28 MED ORDER — CETIRIZINE HCL 10 MG PO TABS: 10.0000 mg | ORAL_TABLET | Freq: Every day | ORAL | 0 refills | Status: AC

## 2020-09-28 MED ORDER — TIZANIDINE HCL 4 MG PO TABS: 4.0000 mg | ORAL_TABLET | Freq: Every day | ORAL | 0 refills | Status: AC

## 2020-09-28 NOTE — ED Provider Notes (Signed)
[facility]   MRN: [id number] DOB: 05-24-1991  Subjective:   Alvin Webb is a 30 y.o. male presenting for 2-day history of persistent intermittent chest pain.  Patient states that bit.  Wanted to make sure now that he is having more persistent and constant chest pain that he gets checked out.  He does have a history of asthma.  Has not needed an inhaler for many years.  Denies history of heart conditions, arrhythmias.  Denies fever, runny or stuffy nose, cough, shortness of breath, body aches. Patient is working on quitting smoking. Smokes 1/4-1/3 ppd.  Denies taking chronic medications.  No Known Allergies  Past Medical History:  Diagnosis Date  . Asthma    as child     Past Surgical History:  Procedure Laterality Date  . NO PAST SURGERIES    . ORIF ANKLE FRACTURE Right 09/26/2013   Procedure: OPEN REDUCTION INTERNAL FIXATION (ORIF) RIGHT  ANKLE FRACTURE;  Surgeon: Budd Palmer, MD;  Location: [facility] OR;  Service: Orthopedics;  Laterality: Right;    History reviewed. No pertinent family history.  Social History   Tobacco Use  . Smoking status: Current Every Day Smoker    Packs/day: 0.25    Years: 4.00    Pack years: 1.00    Types: Cigarettes  . Smokeless tobacco: Never Used  Substance Use Topics  . Alcohol use: Yes    Comment: 1 per day  . Drug use: No    Types: Marijuana    Comment: stopped a 1 1/2 month ago.     ROS   Objective:   Vitals: BP (!) 162/85 (BP Location: Right Arm)   Pulse 81   Temp 98.4 F (36.9 C) (Oral)   Resp 18   SpO2 98%   Physical Exam Constitutional:      General: He is not in acute distress.    Appearance: Normal appearance. He is well-developed. He is not ill-appearing, toxic-appearing or diaphoretic.  HENT:     Head: Normocephalic and atraumatic.     Right Ear: External ear normal.     Left Ear: External ear normal.     Nose: Nose normal.     Mouth/Throat:     Mouth: Mucous membranes are moist.      Pharynx: Oropharynx is clear.  Eyes:     General: No scleral icterus.       Right eye: No discharge.        Left eye: No discharge.     Extraocular Movements: Extraocular movements intact.     Conjunctiva/sclera: Conjunctivae normal.     Pupils: Pupils are equal, round, and reactive to light.  Cardiovascular:     Rate and Rhythm: Normal rate and regular rhythm.     Heart sounds: Normal heart sounds. No murmur heard. No friction rub. No gallop.   Pulmonary:     Effort: Pulmonary effort is normal. No respiratory distress.     Breath sounds: Normal breath sounds. No stridor. No wheezing, rhonchi or rales.  Chest:     Chest wall: No tenderness.  Neurological:     Mental Status: He is alert and oriented to person, place, and time.  Psychiatric:        Mood and Affect: Mood normal.        Behavior: Behavior normal.        Thought Content: Thought content normal.        Judgment: Judgment normal.     ED ECG REPORT  Date: 09/28/2020  Rate: 71bpm  Rhythm: normal sinus rhythm  QRS Axis: normal  Intervals: normal  ST/T Wave abnormalities: normal  Conduction Disutrbances:none  Narrative Interpretation: Sinus rhythm at 71 bpm, very comparable to previous EKG from 02/09/2020.  Old EKG Reviewed: unchanged  I have personally reviewed the EKG tracing and agree with the computerized printout as noted.  DG Chest 2 View  Result Date: 09/28/2020 CLINICAL DATA:  Central chest pain EXAM: CHEST - 2 VIEW COMPARISON:  02/06/2019 FINDINGS: The heart size and mediastinal contours are within normal limits. Both lungs are clear. The visualized skeletal structures are unremarkable. IMPRESSION: No active cardiopulmonary disease. Electronically Signed   By: Alcide Clever M.D.   On: 09/28/2020 10:31    Assessment and Plan :   PDMP not reviewed this encounter.  1. Atypical chest pain   2. Allergic rhinitis due to other allergic trigger, unspecified seasonality   3. Mild intermittent asthma,  unspecified whether complicated   4. Smoking     Patient has low risk factors for ACS, chest PE.  It is more likely that he is having difficulty with his asthma in the setting of smoking, uncontrolled allergies.  Recommended scheduling his albuterol inhaler, starting Flonase and Zyrtec, quit smoking.  To address his chest pain will also use Naprosyn and tizanidine. Counseled patient on potential for adverse effects with medications prescribed/recommended today, ER and return-to-clinic precautions discussed, patient verbalized understanding.    Wallis Bamberg, PA-C 09/28/20 1041

## 2020-09-28 NOTE — ED Triage Notes (Signed)
Pt reports central CP . Pt reports pain worse when he sneezes.

## 2022-06-21 IMAGING — CR DG CHEST 2V
2 series · 2 of 2 positions shown · non-contrast
Comparison: None.

CLINICAL DATA: Chest pain

EXAM:
CHEST - 2 VIEW

[chest pa]
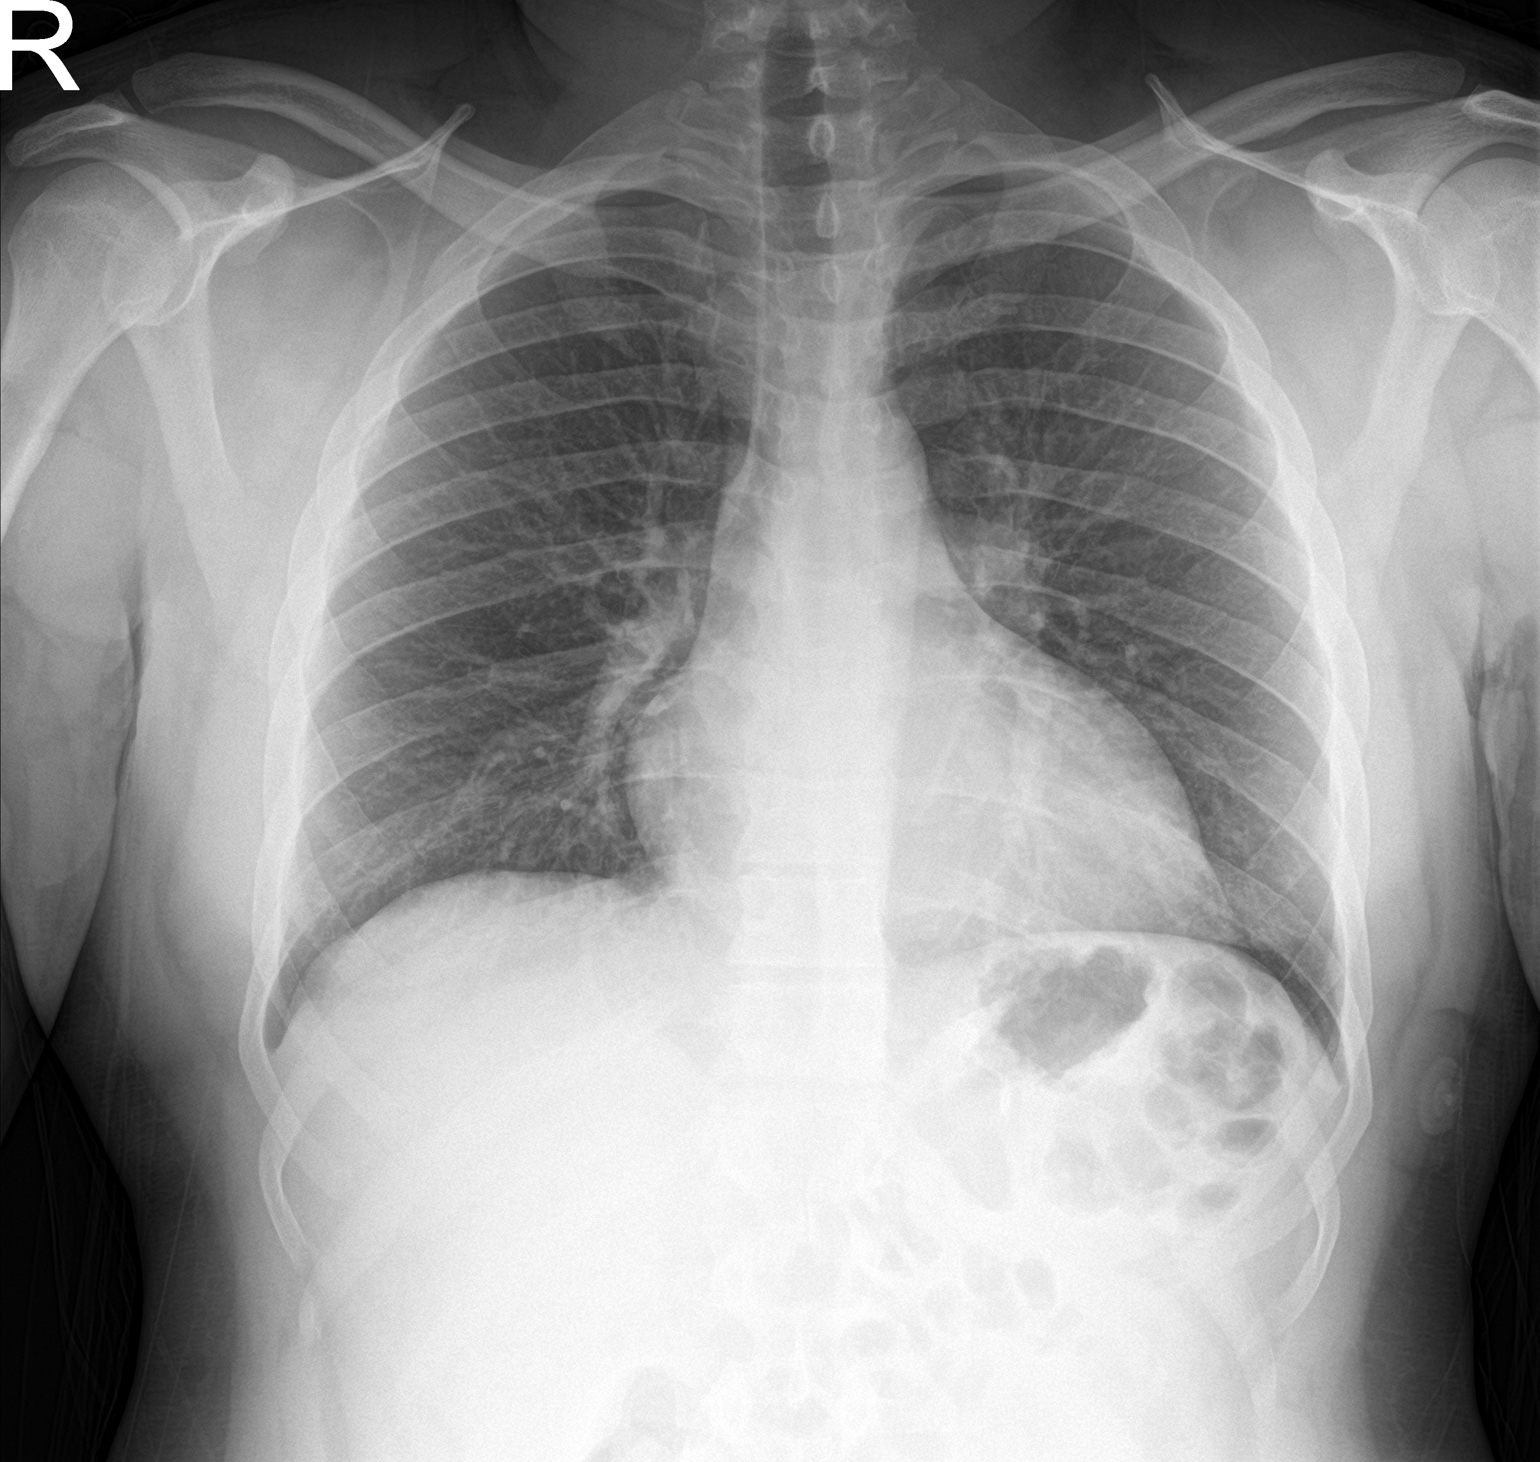

[chest lat]
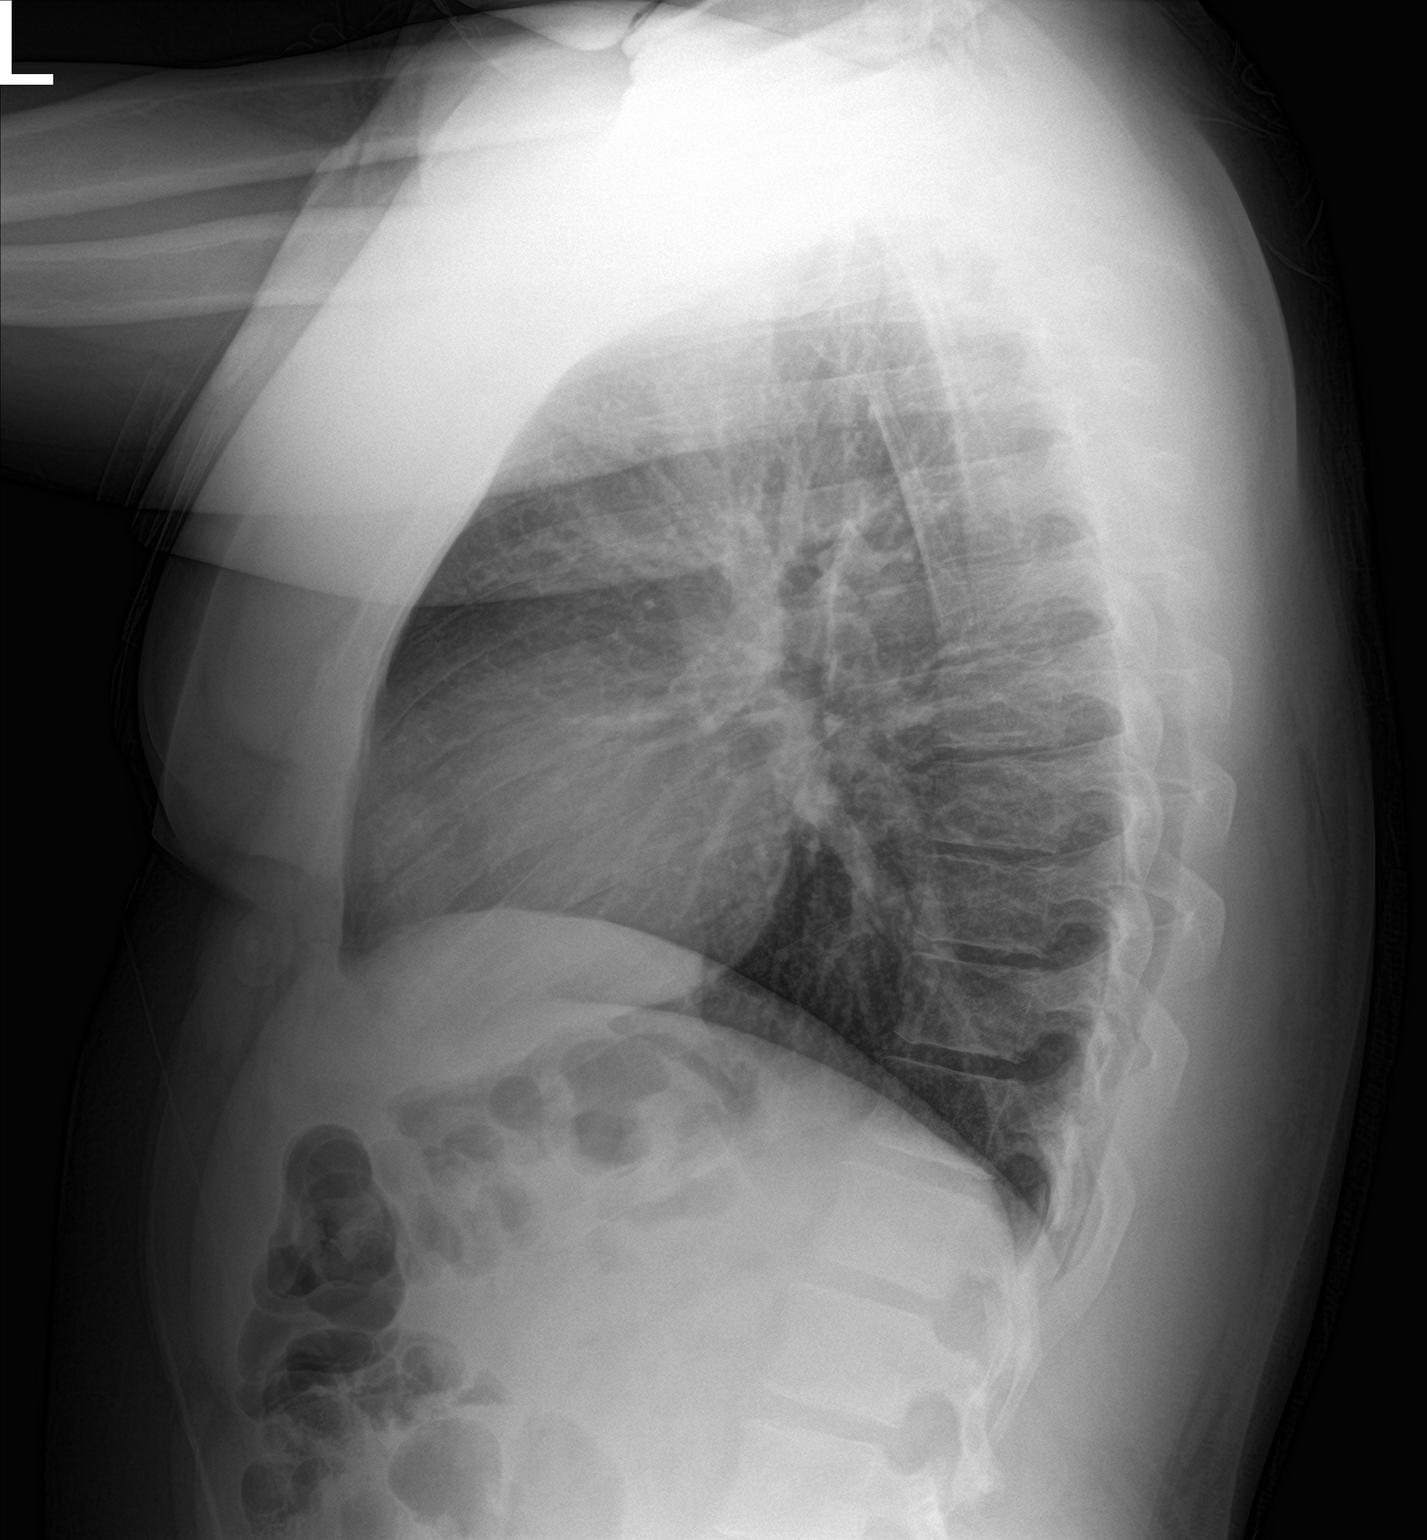

[2 of 2 positions shown; findings below may reference images not displayed]

FINDINGS: The heart size and mediastinal contours are within normal limits.
Both lungs are clear. The visualized skeletal structures are
unremarkable.
IMPRESSION: No active cardiopulmonary disease.

## 2023-02-11 IMAGING — DX DG CHEST 2V
2 series · 2 of 2 positions shown · non-contrast
Comparison: 02/06/2019

CLINICAL DATA: Central chest pain

EXAM:
CHEST - 2 VIEW

[chest pa]
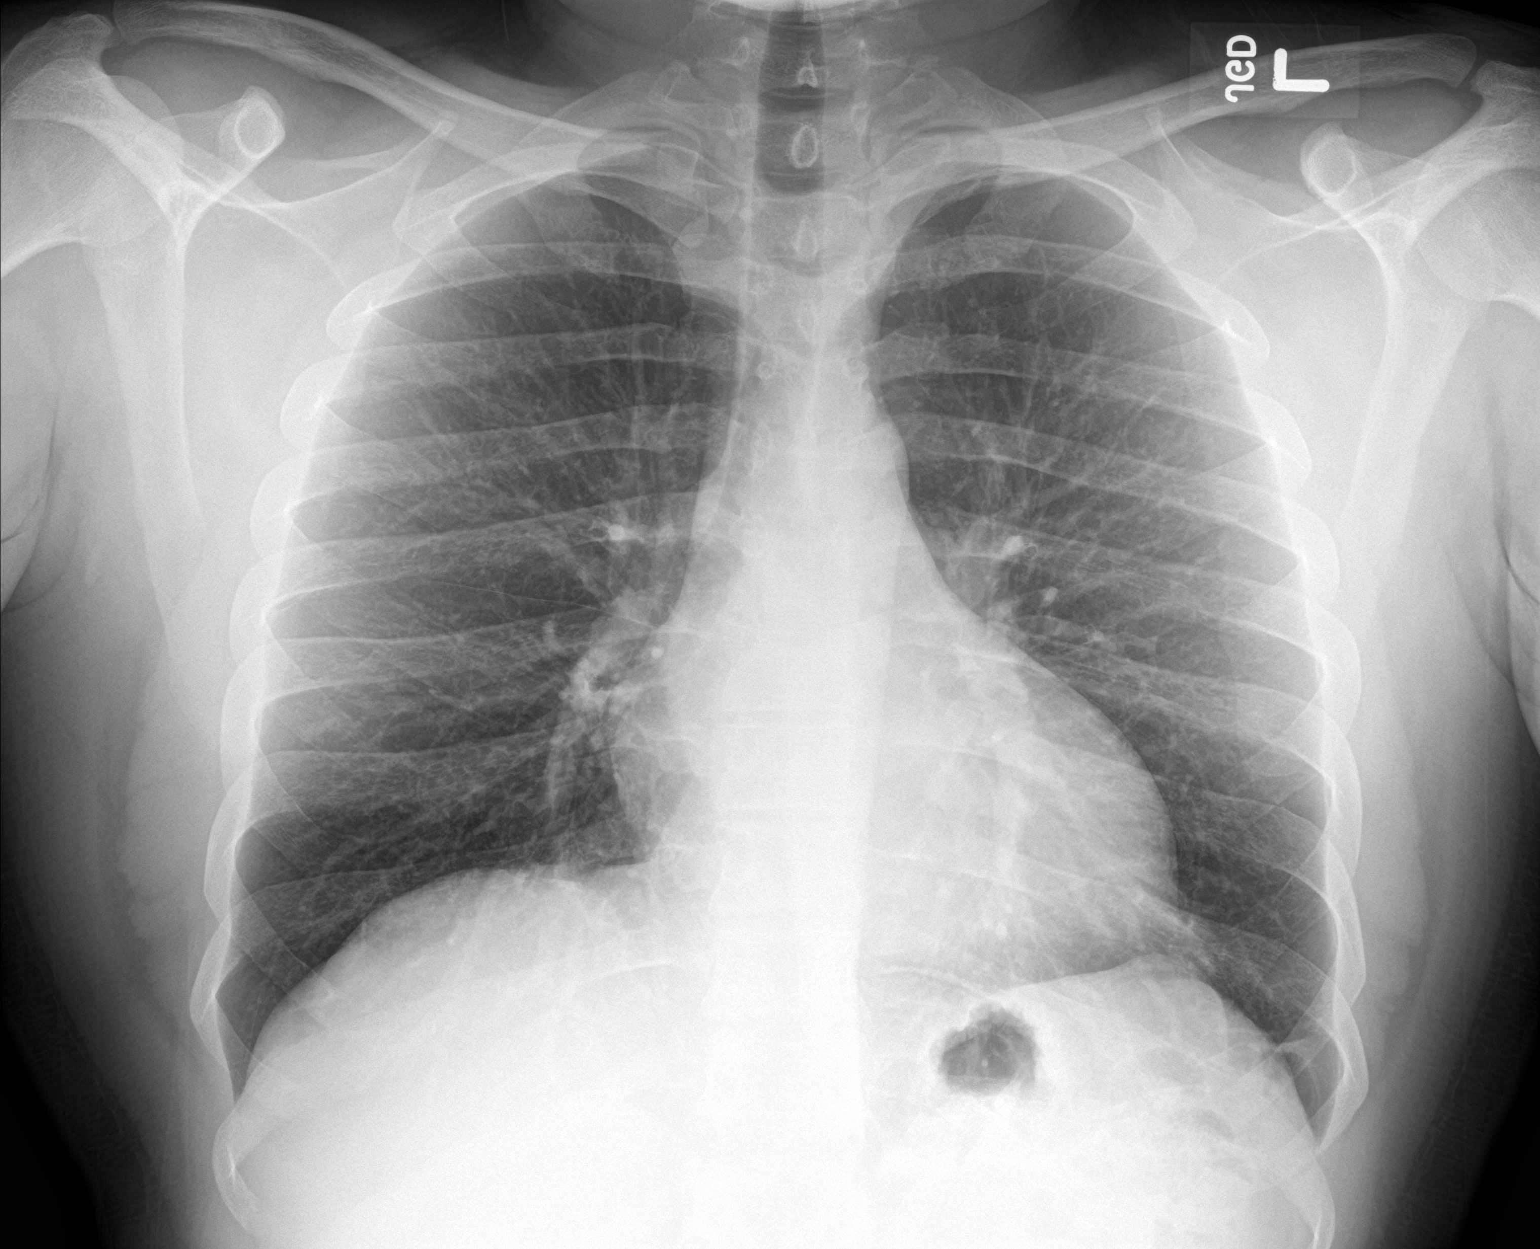

[chest lat]
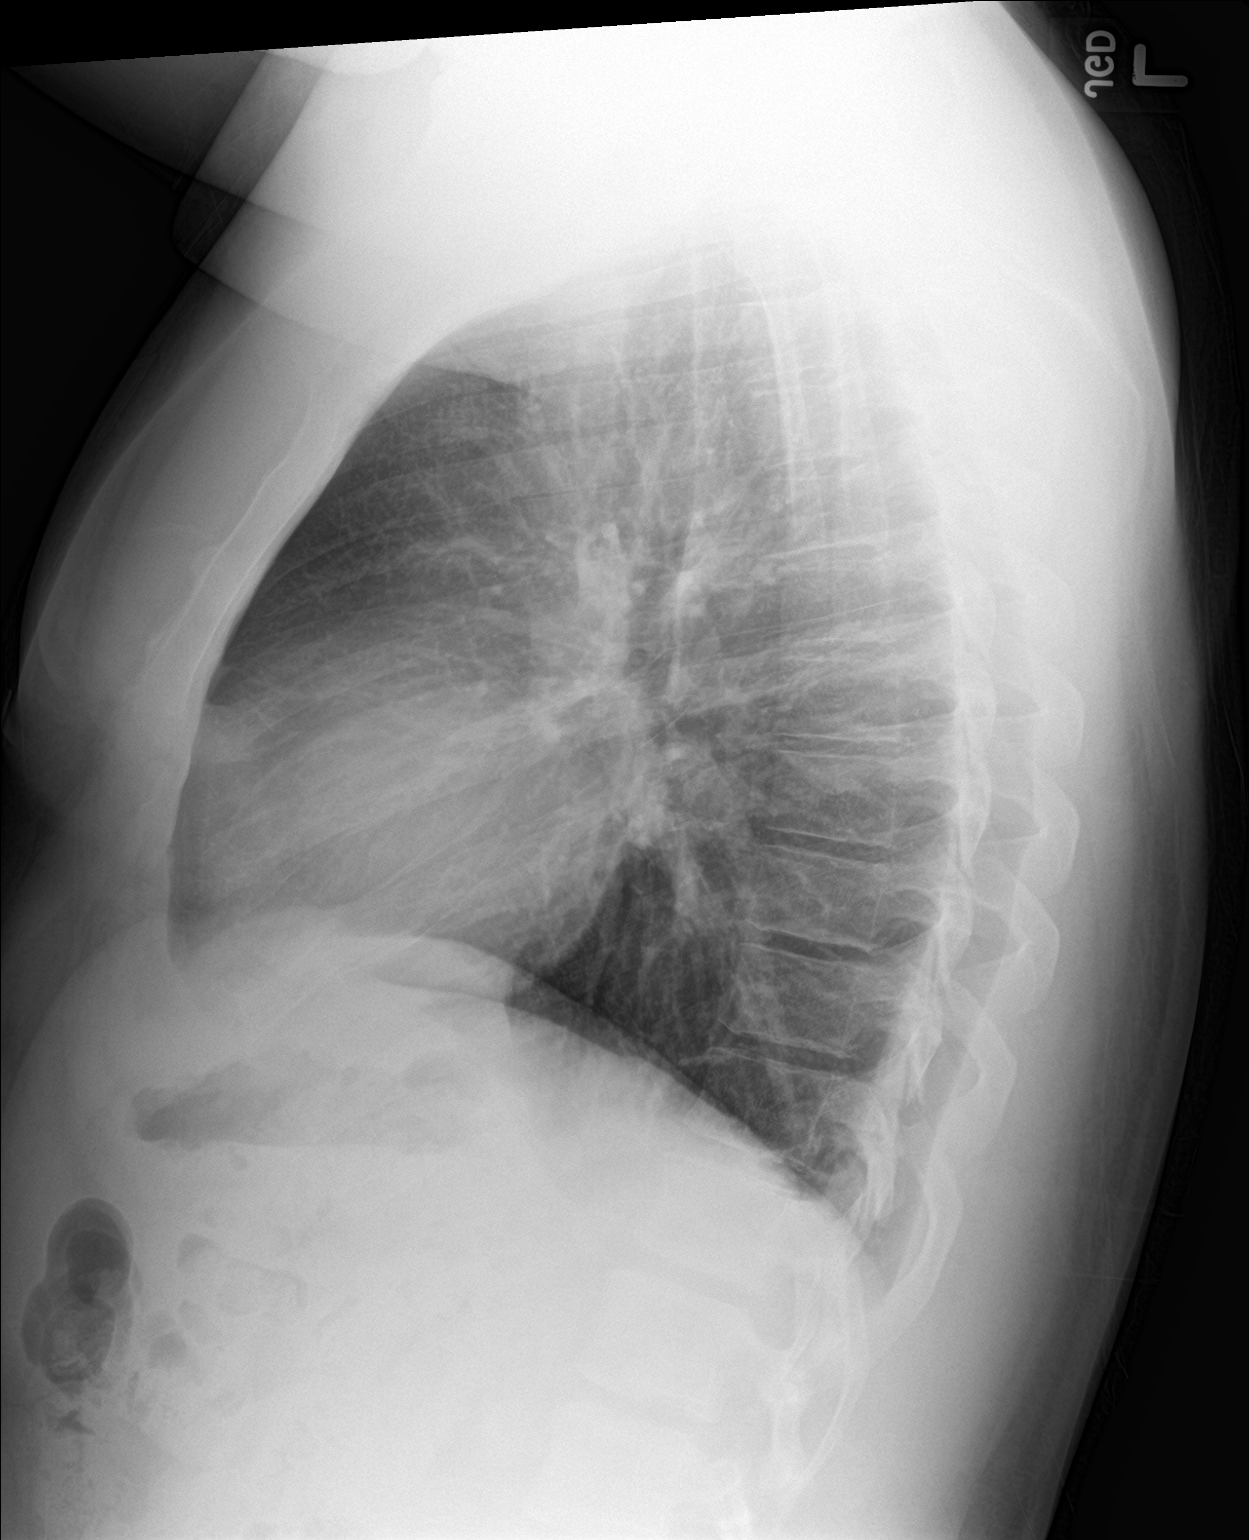

[2 of 2 positions shown; findings below may reference images not displayed]

FINDINGS: The heart size and mediastinal contours are within normal limits.
Both lungs are clear. The visualized skeletal structures are
unremarkable.
IMPRESSION: No active cardiopulmonary disease.
